# Patient Record
Sex: Female | Born: 1965 | Race: Black or African American | Hispanic: No | Marital: Married | Smoking: Former smoker
Health system: Southern US, Community
[De-identification: ages and names within clinical notes are randomized; demographics above are authoritative.]

## PROBLEM LIST (undated history)

## (undated) DIAGNOSIS — R011 Cardiac murmur, unspecified: Secondary | ICD-10-CM

## (undated) DIAGNOSIS — I10 Essential (primary) hypertension: Secondary | ICD-10-CM

## (undated) DIAGNOSIS — G43909 Migraine, unspecified, not intractable, without status migrainosus: Secondary | ICD-10-CM

## (undated) DIAGNOSIS — Z598 Other problems related to housing and economic circumstances: Secondary | ICD-10-CM

## (undated) DIAGNOSIS — I219 Acute myocardial infarction, unspecified: Secondary | ICD-10-CM

## (undated) DIAGNOSIS — Z599 Problem related to housing and economic circumstances, unspecified: Secondary | ICD-10-CM

## (undated) HISTORY — PX: OTHER SURGICAL HISTORY: SHX169

## (undated) HISTORY — PX: APPENDECTOMY: SHX54

## (undated) HISTORY — PX: CARDIAC CATHETERIZATION: SHX172

---
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
## 1997-12-20 HISTORY — PX: ESOPHAGOGASTRODUODENOSCOPY (EGD) WITH PROPOFOL: SHX5813

## 1999-12-21 HISTORY — PX: CARDIAC CATHETERIZATION: SHX172

## 2010-08-17 ENCOUNTER — Inpatient Hospital Stay (HOSPITAL_COMMUNITY): Admission: EM | Admit: 2010-08-17 | Discharge: 2010-08-18 | Payer: Self-pay | Admitting: Emergency Medicine

## 2010-08-17 ENCOUNTER — Ambulatory Visit: Payer: Self-pay | Admitting: Cardiology

## 2010-09-22 DIAGNOSIS — I10 Essential (primary) hypertension: Secondary | ICD-10-CM

## 2011-03-05 LAB — LIPID PANEL
Cholesterol: 155 mg/dL (ref 0–200)
HDL: 62 mg/dL (ref >39)
LDL Cholesterol: 80 mg/dL (ref 0–99)
Total CHOL/HDL Ratio: 2.5 RATIO
Triglycerides: 63 mg/dL (ref <150)

## 2011-03-05 LAB — COMPREHENSIVE METABOLIC PANEL
ALT: 11 U/L (ref 0–35)
Alkaline Phosphatase: 49 U/L (ref 39–117)
CO2: 25 mEq/L (ref 19–32)
Creatinine, Ser: 0.85 mg/dL (ref 0.4–1.2)
Potassium: 3.5 mEq/L (ref 3.5–5.1)
Sodium: 138 mEq/L (ref 135–145)

## 2011-03-05 LAB — CBC
Hemoglobin: 12.9 g/dL (ref 12.0–15.0)
MCH: 30.2 pg (ref 26.0–34.0)
MCH: 30.8 pg (ref 26.0–34.0)
MCHC: 33 g/dL (ref 30.0–36.0)
MCV: 91.7 fL (ref 78.0–100.0)
Platelets: 185 10*3/uL (ref 150–400)
Platelets: 196 10*3/uL (ref 150–400)
RBC: 4.19 MIL/uL (ref 3.87–5.11)
RBC: 4.2 MIL/uL (ref 3.87–5.11)
RDW: 13.9 % (ref 11.5–15.5)
WBC: 7.3 10*3/uL (ref 4.0–10.5)

## 2011-03-05 LAB — BASIC METABOLIC PANEL
BUN: 7 mg/dL (ref 6–23)
Calcium: 8.9 mg/dL (ref 8.4–10.5)
Creatinine, Ser: 0.87 mg/dL (ref 0.4–1.2)
GFR calc Af Amer: >60 mL/min (ref >60)
GFR calc non Af Amer: >60 mL/min (ref >60)
Glucose, Bld: 99 mg/dL (ref 70–99)
Sodium: 137 mEq/L (ref 135–145)

## 2011-03-05 LAB — PROTIME-INR: Prothrombin Time: 13.1 seconds (ref 11.6–15.2)

## 2011-03-05 LAB — CARDIAC PANEL(CRET KIN+CKTOT+MB+TROPI)
CK, MB: 0.9 ng/mL (ref 0.3–4.0)
Relative Index: 0.6 (ref 0.0–2.5)
Relative Index: 0.6 (ref 0.0–2.5)
Troponin I: 0.04 ng/mL (ref 0.00–0.06)

## 2011-03-05 LAB — APTT: aPTT: 31 seconds (ref 24–37)

## 2011-03-05 LAB — CK TOTAL AND CKMB (NOT AT ARMC): Relative Index: 0.5 (ref 0.0–2.5)

## 2011-03-05 LAB — TROPONIN I: Troponin I: 0.01 ng/mL (ref 0.00–0.06)

## 2011-03-05 LAB — DIFFERENTIAL
Basophils Absolute: 0.1 10*3/uL (ref 0.0–0.1)
Lymphs Abs: 2.2 10*3/uL (ref 0.7–4.0)
Monocytes Relative: 5 % (ref 3–12)
Neutro Abs: 4.5 10*3/uL (ref 1.7–7.7)
Neutrophils Relative %: 61 % (ref 43–77)

## 2011-03-05 LAB — HEPARIN LEVEL (UNFRACTIONATED): Heparin Unfractionated: 0.35 IU/mL (ref 0.30–0.70)

## 2011-03-05 LAB — POCT CARDIAC MARKERS
Myoglobin, poc: 45.2 ng/mL (ref 12–200)
Troponin i, poc: <0.05 ng/mL (ref 0.00–0.09)

## 2011-12-28 ENCOUNTER — Encounter: Payer: Self-pay | Admitting: Emergency Medicine

## 2011-12-28 ENCOUNTER — Emergency Department (HOSPITAL_COMMUNITY): Payer: Self-pay

## 2011-12-28 ENCOUNTER — Emergency Department (HOSPITAL_COMMUNITY)
Admission: EM | Admit: 2011-12-28 | Discharge: 2011-12-29 | Disposition: A | Discharge status: Home / Self Care | Payer: Self-pay | Attending: Emergency Medicine | Admitting: Emergency Medicine

## 2011-12-28 DIAGNOSIS — R05 Cough: Secondary | ICD-10-CM | POA: Insufficient documentation

## 2011-12-28 DIAGNOSIS — J45901 Unspecified asthma with (acute) exacerbation: Secondary | ICD-10-CM | POA: Insufficient documentation

## 2011-12-28 DIAGNOSIS — R0602 Shortness of breath: Secondary | ICD-10-CM | POA: Insufficient documentation

## 2011-12-28 DIAGNOSIS — R059 Cough, unspecified: Secondary | ICD-10-CM | POA: Insufficient documentation

## 2011-12-28 DIAGNOSIS — J45909 Unspecified asthma, uncomplicated: Secondary | ICD-10-CM

## 2011-12-28 MED ORDER — ALBUTEROL SULFATE (5 MG/ML) 0.5% IN NEBU
2.5000 mg | INHALATION_SOLUTION | Freq: Once | RESPIRATORY_TRACT | Status: AC
  Administered 2011-12-28: 5 mg via RESPIRATORY_TRACT
  Filled 2011-12-28: qty 1

## 2011-12-28 NOTE — ED Notes (Signed)
PT. REPORTS PROGRESSING SOB WITH COUGH AND WHEEZING ONSET TODAY .

## 2011-12-29 MED ORDER — IPRATROPIUM BROMIDE 0.02 % IN SOLN
RESPIRATORY_TRACT | Status: AC
  Administered 2011-12-29: 02:00:00
  Filled 2011-12-29: qty 2.5

## 2011-12-29 MED ORDER — PREDNISONE 20 MG PO TABS
ORAL_TABLET | ORAL | Status: AC
  Administered 2011-12-29: 60 mg via ORAL
  Filled 2011-12-29: qty 3

## 2011-12-29 MED ORDER — ALBUTEROL SULFATE HFA 108 (90 BASE) MCG/ACT IN AERS
2.0000 | INHALATION_SPRAY | RESPIRATORY_TRACT | Status: DC | PRN
  Administered 2011-12-29: 2 via RESPIRATORY_TRACT
  Filled 2011-12-29: qty 6.7

## 2011-12-29 MED ORDER — ALBUTEROL SULFATE (5 MG/ML) 0.5% IN NEBU
INHALATION_SOLUTION | RESPIRATORY_TRACT | Status: AC
  Administered 2011-12-29: 02:00:00
  Filled 2011-12-29: qty 1

## 2011-12-29 MED ORDER — PREDNISONE 10 MG PO TABS: 20.0000 mg | ORAL_TABLET | Freq: Every day | ORAL | Status: DC

## 2011-12-29 MED ORDER — AMLODIPINE BESYLATE 2.5 MG PO TABS: 10.0000 mg | ORAL_TABLET | Freq: Every day | ORAL | Status: DC

## 2011-12-29 NOTE — ED Provider Notes (Signed)
Medical screening examination/treatment/procedure(s) were performed by non-physician practitioner and as supervising physician I was immediately available for consultation/collaboration.  Pt requested refill of her norvasc  Lyanne Co, MD 12/29/11 [phone]

## 2011-12-29 NOTE — ED Provider Notes (Signed)
History     CSN: [id number]  Arrival date & time 12/28/11  2201   None     Chief Complaint  Patient presents with  . Shortness of Breath    (Consider location/radiation/quality/duration/timing/severity/associated sxs/prior treatment) HPI Comments: Patient with remote history of asthma as a child.  Now is wheezing, coughing, short of breath.  All day today  Patient is a 46 y.o. female presenting with shortness of breath.  Shortness of Breath  The current episode started today. The problem has been gradually worsening. The problem is moderate. The symptoms are relieved by nothing. The symptoms are aggravated by activity. Associated symptoms include cough, shortness of breath and wheezing. Pertinent negatives include no chest pain, no fever and no rhinorrhea.    Past Medical History  Diagnosis Date  . Asthma     History reviewed. No pertinent past surgical history.  No family history on file.  History  Substance Use Topics  . Smoking status: Never Smoker   . Smokeless tobacco: Not on file  . Alcohol Use: No    OB History    Grav Para Term Preterm Abortions TAB SAB Ect Mult Living                  Review of Systems  Constitutional: Negative for fever.  HENT: Negative for rhinorrhea.   Eyes: Negative.   Respiratory: Positive for cough, shortness of breath and wheezing.   Cardiovascular: Negative for chest pain.  Genitourinary: Negative for dysuria.  Musculoskeletal: Negative.   Neurological: Negative for dizziness.    Allergies  Benadryl and Penicillins  Home Medications   Current Outpatient Rx  Name Route Sig Dispense Refill  . NORVASC PO Oral Take 1 tablet by mouth daily. Dose unknown & pharmacy closed     . PREDNISONE 10 MG PO TABS Oral Take 2 tablets (20 mg total) by mouth daily. 15 tablet 0    BP 158/108  Pulse 97  Temp(Src) 98.9 F (37.2 C) (Oral)  Resp 22  SpO2 99%  Physical Exam  Constitutional: She is oriented to person, place, and time.  She appears well-developed and well-nourished.  Neck: Normal range of motion.  Cardiovascular: Normal rate.   Pulmonary/Chest: No respiratory distress. She has wheezes.       Coughing chest wall is tender on the right  Abdominal: Soft. Bowel sounds are normal.  Musculoskeletal: Normal range of motion.  Neurological: She is alert and oriented to person, place, and time.  Skin: Skin is warm and dry.  Psychiatric: She has a normal mood and affect.    ED Course  Procedures (including critical care time)  Labs Reviewed - No data to display Dg Chest 2 View  12/28/2011  *RADIOLOGY REPORT*  Clinical Data: Cough.  Fever.  CHEST - 2 VIEW  Comparison: 08/17/2010.  Findings: Mild basilar atelectasis.  No airspace disease.  No effusion.  Cardiopericardial silhouette and mediastinal contours appear within normal limits.  IMPRESSION: No acute cardiopulmonary disease.  Original Report Authenticated By: Andreas Newport, M.D.     1. Asthma, acute     After second albuterol, Atrovent, and by mouth prednisone.  Patient improved no longer wheezing, resting, comfortably we'll discharge with an inhaler, as well as a prescription for prednisone for the next 5 days  MDM  No pneumonia seen on x-ray.  This most likely asthma exacerbation.  Will treat with steroids and albuterol, Atrovent.  We'll monitor patient for short period of time  Arman Filter, NP 12/29/11 4540  Arman Filter, NP 12/29/11 9811

## 2011-12-29 NOTE — ED Notes (Signed)
2:52 vitals should have been entered as 01:20 from paper chart.

## 2011-12-30 NOTE — ED Notes (Signed)
Prescription for prednisone (deltasone) called in to [facility] as directed on discharge per gail schulz, np; pt states drug store did not receive via e-script.

## 2012-04-10 ENCOUNTER — Emergency Department (HOSPITAL_COMMUNITY)
Admission: EM | Admit: 2012-04-10 | Discharge: 2012-04-10 | Disposition: A | Discharge status: Home / Self Care | Payer: Self-pay | Attending: Emergency Medicine | Admitting: Emergency Medicine

## 2012-04-10 ENCOUNTER — Emergency Department (HOSPITAL_COMMUNITY): Payer: Self-pay

## 2012-04-10 ENCOUNTER — Encounter (HOSPITAL_COMMUNITY): Payer: Self-pay | Admitting: *Deleted

## 2012-04-10 DIAGNOSIS — J45901 Unspecified asthma with (acute) exacerbation: Secondary | ICD-10-CM | POA: Insufficient documentation

## 2012-04-10 MED ORDER — IBUPROFEN 200 MG PO TABS
400.0000 mg | ORAL_TABLET | Freq: Once | ORAL | Status: AC
  Administered 2012-04-10: 400 mg via ORAL
  Filled 2012-04-10: qty 2

## 2012-04-10 MED ORDER — ALBUTEROL SULFATE (5 MG/ML) 0.5% IN NEBU
5.0000 mg | INHALATION_SOLUTION | Freq: Once | RESPIRATORY_TRACT | Status: AC
  Administered 2012-04-10: 5 mg via RESPIRATORY_TRACT
  Filled 2012-04-10: qty 1

## 2012-04-10 MED ORDER — PREDNISONE 20 MG PO TABS
60.0000 mg | ORAL_TABLET | Freq: Once | ORAL | Status: AC
  Administered 2012-04-10: 60 mg via ORAL
  Filled 2012-04-10: qty 3

## 2012-04-10 MED ORDER — ALBUTEROL SULFATE HFA 108 (90 BASE) MCG/ACT IN AERS
2.0000 | INHALATION_SPRAY | Freq: Once | RESPIRATORY_TRACT | Status: AC
  Administered 2012-04-10: 2 via RESPIRATORY_TRACT
  Filled 2012-04-10: qty 6.7

## 2012-04-10 MED ORDER — PREDNISONE 20 MG PO TABS: 40.0000 mg | ORAL_TABLET | Freq: Every day | ORAL | Status: AC

## 2012-04-10 MED ORDER — IPRATROPIUM BROMIDE 0.02 % IN SOLN
0.5000 mg | Freq: Once | RESPIRATORY_TRACT | Status: DC
  Filled 2012-04-10: qty 2.5

## 2012-04-10 MED ORDER — ALBUTEROL SULFATE HFA 108 (90 BASE) MCG/ACT IN AERS: 2.0000 | INHALATION_SPRAY | RESPIRATORY_TRACT | Status: DC | PRN

## 2012-04-10 NOTE — ED Notes (Signed)
To ED with sob. States she had to use her customers inhaler. Pt unable to speak in full sentences. Coughing.

## 2012-04-10 NOTE — Discharge Instructions (Signed)
Asthma Attack Prevention HOW CAN ASTHMA BE PREVENTED? Currently, there is no way to prevent asthma from starting. However, you can take steps to control the disease and prevent its symptoms after you have been diagnosed. Learn about your asthma and how to control it. Take an active role to control your asthma by working with your caregiver to create and follow an asthma action plan. An asthma action plan guides you in taking your medicines properly, avoiding factors that make your asthma worse, tracking your level of asthma control, responding to worsening asthma, and seeking emergency care when needed. To track your asthma, keep records of your symptoms, check your peak flow number using a peak flow meter (handheld device that shows how well air moves out of your lungs), and get regular asthma checkups.  Other ways to prevent asthma attacks include:  Use medicines as your caregiver directs.   Identify and avoid things that make your asthma worse (as much as you can).   Keep track of your asthma symptoms and level of control.   Get regular checkups for your asthma.   With your caregiver, write a detailed plan for taking medicines and managing an asthma attack. Then be sure to follow your action plan. Asthma is an ongoing condition that needs regular monitoring and treatment.   Identify and avoid asthma triggers. A number of outdoor allergens and irritants (pollen, mold, cold air, air pollution) can trigger asthma attacks. Find out what causes or makes your asthma worse, and take steps to avoid those triggers (see below).   Monitor your breathing. Learn to recognize warning signs of an attack, such as slight coughing, wheezing or shortness of breath. However, your lung function may already decrease before you notice any signs or symptoms, so regularly measure and record your peak airflow with a home peak flow meter.   Identify and treat attacks early. If you act quickly, you're less likely to have  a severe attack. You will also need less medicine to control your symptoms. When your peak flow measurements decrease and alert you to an upcoming attack, take your medicine as instructed, and immediately stop any activity that may have triggered the attack. If your symptoms do not improve, get medical help.   Pay attention to increasing quick-relief inhaler use. If you find yourself relying on your quick-relief inhaler (such as albuterol), your asthma is not under control. See your caregiver about adjusting your treatment.  IDENTIFY AND CONTROL FACTORS THAT MAKE YOUR ASTHMA WORSE A number of common things can set off or make your asthma symptoms worse (asthma triggers). Keep track of your asthma symptoms for several weeks, detailing all the environmental and emotional factors that are linked with your asthma. When you have an asthma attack, go back to your asthma diary to see which factor, or combination of factors, might have contributed to it. Once you know what these factors are, you can take steps to control many of them.  Allergies: If you have allergies and asthma, it is important to take asthma prevention steps at home. Asthma attacks (worsening of asthma symptoms) can be triggered by allergies, which can cause temporary increased inflammation of your airways. Minimizing contact with the substance to which you are allergic will help prevent an asthma attack. Animal Dander:   Some people are allergic to the flakes of skin or dried saliva from animals with fur or feathers. Keep these pets out of your home.   If you can't keep a pet outdoors, keep the   pet out of your bedroom and other sleeping areas at all times, and keep the door closed.   Remove carpets and furniture covered with cloth from your home. If that is not possible, keep the pet away from fabric-covered furniture and carpets.  Dust Mites:  Many people with asthma are allergic to dust mites. Dust mites are tiny bugs that are found in  every home, in mattresses, pillows, carpets, fabric-covered furniture, bedcovers, clothes, stuffed toys, fabric, and other fabric-covered items.   Cover your mattress in a special dust-proof cover.   Cover your pillow in a special dust-proof cover, or wash the pillow each week in hot water. Water must be hotter than 130 F to kill dust mites. Cold or warm water used with detergent and bleach can also be effective.   Wash the sheets and blankets on your bed each week in hot water.   Try not to sleep or lie on cloth-covered cushions.   Call ahead when traveling and ask for a smoke-free hotel room. Bring your own bedding and pillows, in case the hotel only supplies feather pillows and down comforters, which may contain dust mites and cause asthma symptoms.   Remove carpets from your bedroom and those laid on concrete, if you can.   Keep stuffed toys out of the bed, or wash the toys weekly in hot water or cooler water with detergent and bleach.  Cockroaches:  Many people with asthma are allergic to the droppings and remains of cockroaches.   Keep food and garbage in closed containers. Never leave food out.   Use poison baits, traps, powders, gels, or paste (for example, boric acid).   If a spray is used to kill cockroaches, stay out of the room until the odor goes away.  Indoor Mold:  Fix leaky faucets, pipes, or other sources of water that have mold around them.   Clean moldy surfaces with a cleaner that has bleach in it.  Pollen and Outdoor Mold:  When pollen or mold spore counts are high, try to keep your windows closed.   Stay indoors with windows closed from late morning to afternoon, if you can. Pollen and some mold spore counts are highest at that time.   Ask your caregiver whether you need to take or increase anti-inflammatory medicine before your allergy season starts.  Irritants:   Tobacco smoke is an irritant. If you smoke, ask your caregiver how you can quit. Ask family  members to quit smoking, too. Do not allow smoking in your home or car.   If possible, do not use a wood-burning stove, kerosene heater, or fireplace. Minimize exposure to all sources of smoke, including incense, candles, fires, and fireworks.   Try to stay away from strong odors and sprays, such as perfume, talcum powder, hair spray, and paints.   Decrease humidity in your home and use an indoor air cleaning device. Reduce indoor humidity to below 60 percent. Dehumidifiers or central air conditioners can do this.   Try to have someone else vacuum for you once or twice a week, if you can. Stay out of rooms while they are being vacuumed and for a short while afterward.   If you vacuum, use a dust mask from a hardware store, a double-layered or microfilter vacuum cleaner bag, or a vacuum cleaner with a HEPA filter.   Sulfites in foods and beverages can be irritants. Do not drink beer or wine, or eat dried fruit, processed potatoes, or shrimp if they cause asthma   symptoms.   Cold air can trigger an asthma attack. Cover your nose and mouth with a scarf on cold or windy days.   Several health conditions can make asthma more difficult to manage, including runny nose, sinus infections, reflux disease, psychological stress, and sleep apnea. Your caregiver will treat these conditions, as well.   Avoid close contact with people who have a cold or the flu, since your asthma symptoms may get worse if you catch the infection from them. Wash your hands thoroughly after touching items that may have been handled by people with a respiratory infection.   Get a flu shot every year to protect against the flu virus, which often makes asthma worse for days or weeks. Also get a pneumonia shot once every five to 10 years.  Drugs:  Aspirin and other painkillers can cause asthma attacks. 10% to 20% of people with asthma have sensitivity to aspirin or a group of painkillers called non-steroidal anti-inflammatory drugs  (NSAIDS), such as ibuprofen and naproxen. These drugs are used to treat pain and reduce fevers. Asthma attacks caused by any of these medicines can be severe and even fatal. These drugs must be avoided in people who have known aspirin sensitive asthma. Products with acetaminophen are considered safe for people who have asthma. It is important that people with aspirin sensitivity read labels of all over-the-counter drugs used to treat pain, colds, coughs, and fever.   Beta blockers and ACE inhibitors are other drugs which you should discuss with your caregiver, in relation to your asthma.  ALLERGY SKIN TESTING  Ask your asthma caregiver about allergy skin testing or blood testing (RAST test) to identify the allergens to which you are sensitive. If you are found to have allergies, allergy shots (immunotherapy) for asthma may help prevent future allergies and asthma. With allergy shots, small doses of allergens (substances to which you are allergic) are injected under your skin on a regular schedule. Over a period of time, your body may become used to the allergen and less responsive with asthma symptoms. You can also take measures to minimize your exposure to those allergens. EXERCISE  If you have exercise-induced asthma, or are planning vigorous exercise, or exercise in cold, humid, or dry environments, prevent exercise-induced asthma by following your caregiver's advice regarding asthma treatment before exercising. Document Released: 11/24/2009 Document Revised: 11/25/2011 Document Reviewed: 11/24/2009 [facility] Patient Information 2012 [place], [place].Asthma Prevention Cigarette smoke, house dust, molds, pollens, animal dander, certain insects, exercise, and even cold air are all triggers that can cause an asthma attack. Often, no specific triggers are identified.  Take the following measures around your house to reduce attacks:  Avoid cigarette and other smoke. No smoking should be allowed in a home  where someone with asthma lives. If smoking is allowed indoors, it should be done in a room with a closed door, and a window should be opened to clear the air. If possible, do not use a wood-burning stove, kerosene heater, or fireplace. Minimize exposure to all sources of smoke, including incense, candles, fires, and fireworks.   Decrease pollen exposure. Keep your windows shut and use central air during the pollen allergy season. Stay indoors with windows closed from late morning to afternoon, if you can. Avoid mowing the lawn if you have grass pollen allergy. Change your clothes and shower after being outside during this time of year.   Remove molds from bathrooms and wet areas. Do this by cleaning the floors with a fungicide or diluted bleach.  Avoid using humidifiers, vaporizers, or swamp coolers. These can spread molds through the air. Fix leaky faucets, pipes, or other sources of water that have mold around them.   Decrease house dust exposure. Do this by using bare floors, vacuuming frequently, and changing furnace and air cooler filters frequently. Avoid using feather, wool, or foam bedding. Use polyester pillows and plastic covers over your mattress. Wash bedding weekly in hot water (hotter than 130 F).   Try to get someone else to vacuum for you once or twice a week, if you can. Stay out of rooms while they are being vacuumed and for a short while afterward. If you vacuum, use a dust mask (from a hardware store), a double-layered or microfilter vacuum cleaner bag, or a vacuum cleaner with a HEPA filter.   Avoid perfumes, talcum powder, hair spray, paints and other strong odors and fumes.   Keep warm-blooded pets (cats, dogs, rodents, birds) outside the home if they are triggers for asthma. If you can't keep the pet outdoors, keep the pet out of your bedroom and other sleeping areas at all times, and keep the door closed. Remove carpets and furniture covered with cloth from your home. If that is  not possible, keep the pet away from fabric-covered furniture and carpets.   Eliminate cockroaches. Keep food and garbage in closed containers. Never leave food out. Use poison baits, traps, powders, gels, or paste (for example, boric acid). If a spray is used to kill cockroaches, stay out of the room until the odor goes away.   Decrease indoor humidity to less than 60%. Use an indoor air cleaning device.   Avoid sulfites in foods and beverages. Do not drink beer or wine or eat dried fruit, processed potatoes, or shrimp if they cause asthma symptoms.   Avoid cold air. Cover your nose and mouth with a scarf on cold or windy days.   Avoid aspirin. This is the most common drug causing serious asthma attacks.   If exercise triggers your asthma, ask your caregiver how you should prepare before exercising. (For example, ask if you could use your inhaler 10 minutes before exercising.)   Avoid close contact with people who have a cold or the flu since your asthma symptoms may get worse if you catch the infection from them. Wash your hands thoroughly after touching items that may have been handled by others with a respiratory infection.   Get a flu shot every year to protect against the flu virus, which often makes asthma worse for days to weeks. Also get a pneumonia shot once every five to 10 years.  Call your caregiver if you want further information about measures you can take to help prevent asthma attacks. Document Released: 12/06/2005 Document Revised: 11/25/2011 Document Reviewed: 10/14/2009 [facility] Patient Information 2012 [place], [place].Asthma, Adult Asthma is caused by narrowing of the air passages in the lungs. It may be triggered by pollen, dust, animal dander, molds, some foods, respiratory infections, exposure to smoke, exercise, emotional stress or other allergens (things that cause allergic reactions or allergies). Repeat attacks are common. HOME CARE INSTRUCTIONS   Use prescription  medications as ordered by your caregiver.   Avoid pollen, dust, animal dander, molds, smoke and other things that cause attacks at home and at work.   You may have fewer attacks if you decrease dust in your home. Electrostatic air cleaners may help.   It may help to replace your pillows or mattress with materials less likely to cause allergies.  Talk to your caregiver about an action plan for managing asthma attacks at home, including, the use of a peak flow meter which measures the severity of your asthma attack. An action plan can help minimize or stop the attack without having to seek medical care.   If you are not on a fluid restriction, drink 8 to 10 glasses of water each day.   Always have a plan prepared for seeking medical attention, including, calling your physician, accessing local emergency care, and calling 911 (in the [place] for a severe attack.   Discuss possible exercise routines with your caregiver.   If animal dander is the cause of asthma, you may need to get rid of pets.  SEEK MEDICAL CARE IF:   You have wheezing and shortness of breath even if taking medicine to prevent attacks.   You have muscle aches, chest pain or thickening of sputum.   Your sputum changes from clear or white to yellow, green, gray, or bloody.   You have any problems that may be related to the medicine you are taking (such as a rash, itching, swelling or trouble breathing).  SEEK IMMEDIATE MEDICAL CARE IF:   Your usual medicines do not stop your wheezing or there is increased coughing and/or shortness of breath.   You have increased difficulty breathing.   You have a fever.  MAKE SURE YOU:   Understand these instructions.   Will watch your condition.   Will get help right away if you are not doing well or get worse.  Document Released: 12/06/2005 Document Revised: 11/25/2011 Document Reviewed: 07/24/2008 [facility] Patient Information 2012 [place], [place].  RESOURCE GUIDE  Dental  Problems  Patients with Medicaid: [facility] [phone]                                           [phone] Phone:  [phone]                                                  Phone:  [phone]  If unable to pay or uninsured, contact:  [facility] or [facility]. to become qualified for the adult dental clinic.  Chronic Pain Problems Contact [facility]  [phone] Patients need to be referred by their primary care doctor.  Insufficient Money for Medicine Contact [employer]:  call "211" or [facility] ([phone] [phone].  No Primary Care Doctor Call [facility]  503-361-8249 Other agencies that provide inexpensive medical care    [facility]  8165129013    [facility]  [phone]    [facility]  [phone]    [facility]  [phone]    [facility]  [phone]    [facility]  [phone]  Psychological Services [facility]  [phone] [facility]  [phone] [facility]   [phone] (emergency services ([phone])170-4434)  Substance Abuse Resources [facility]  [phone] [facility] [phone] [facility] [phone] Floydene Flock [phone] Residential & Outpatient Substance Abuse Program  ([phone]  Abuse/Neglect [facility] Child Abuse Hotline 765-646-6289 [facility] Child Abuse Hotline 3304992122 (After Hours)  Emergency Shelter [facility] [phone]  [facility] [place] [facility] 534-055-7975 [facility] [phone]  MRSA Hotline #:   (859) 020-6346    [facility] Resources  [facility] [place]     [employer]                          [facility] Dept. [address] [place]                       [address]      [address]    Blondell Reveal Phone:  [phone]                                   Phone:  [phone]                 Phone:  [phone]  [facility] Phone:  [phone]  [facility] Child Abuse Hotline [phone] (After Hours)

## 2012-04-15 NOTE — ED Provider Notes (Signed)
History    46 year old female with dyspnea. Patient has a history of asthma and says current symptoms feel similar to previous exacerbations. Patient did not have her inhaler on her and had to use a bystanders albuterol. No Fevers or chills. Onset of symptoms was a few hours prior to arrival and have been stable. No unusual leg pain or swelling. Denies history of blood clot. Denies any chest pain. CSN: [id number]  Arrival date & time 04/10/12  1743   First MD Initiated Contact with Patient 04/10/12 1804      Chief Complaint  Patient presents with  . Shortness of Breath    (Consider location/radiation/quality/duration/timing/severity/associated sxs/prior treatment) HPI  Past Medical History  Diagnosis Date  . Asthma     History reviewed. No pertinent past surgical history.  History reviewed. No pertinent family history.  History  Substance Use Topics  . Smoking status: Never Smoker   . Smokeless tobacco: Not on file  . Alcohol Use: No    OB History    Grav Para Term Preterm Abortions TAB SAB Ect Mult Living                  Review of Systems   Review of symptoms negative unless otherwise noted in HPI.   Allergies  Benadryl and Penicillins  Home Medications   Current Outpatient Rx  Name Route Sig Dispense Refill  . AMLODIPINE BESYLATE 10 MG PO TABS Oral Take 10 mg by mouth daily.    . ALBUTEROL SULFATE HFA 108 (90 BASE) MCG/ACT IN AERS Inhalation Inhale 2 puffs into the lungs every 4 (four) hours as needed for wheezing. 1 Inhaler 2  . PREDNISONE 20 MG PO TABS Oral Take 2 tablets (40 mg total) by mouth daily. 10 tablet 0    BP 143/86  Pulse 89  Temp(Src) 98.6 F (37 C) (Oral)  Resp 24  SpO2 96%  LMP 04/08/2012  Physical Exam  Nursing note and vitals reviewed. Constitutional: She appears well-developed and well-nourished.  HENT:  Head: Normocephalic and atraumatic.  Eyes: Conjunctivae are normal. Right eye exhibits no discharge. Left eye exhibits no  discharge.  Neck: Neck supple.  Cardiovascular: Normal rate, regular rhythm and normal heart sounds.  Exam reveals no gallop and no friction rub.   No murmur heard. Pulmonary/Chest: She has wheezes.       Mild tachypnea. 4-5 word sentences. Expiratory wheezing b/l  Abdominal: Soft. She exhibits no distension. There is no tenderness.  Musculoskeletal: She exhibits no edema and no tenderness.       Lower extremities symmetric as compared to each other. No calf tenderness. Negative Homan's. No palpable cords.   Neurological: She is alert.  Skin: Skin is warm and dry. She is not diaphoretic.  Psychiatric: She has a normal mood and affect. Her behavior is normal. Thought content normal.    ED Course  Procedures (including critical care time)  Labs Reviewed - No data to display No results found.  Dg Chest 2 View  04/10/2012  *RADIOLOGY REPORT*  Clinical Data: Shortness of breath.  Cough.  CHEST - 2 VIEW  Comparison: Chest x-ray 12/28/2011.  Findings: Lung volumes are normal.  No consolidative airspace disease.  No pleural effusions.  No pneumothorax.  No pulmonary nodule or mass noted.  Pulmonary vasculature and the cardiomediastinal silhouette are within normal limits.  IMPRESSION: 1. No radiographic evidence of acute cardiopulmonary disease.  Original Report Authenticated By: Florencia Reasons, M.D.   1. Asthma exacerbation  MDM  46 year old female with dyspnea. Likely secondary to asthma exacerbation. Patient subjectively feels much better after nebs and steroids in emergency department. Work of breathing has decreased. Oxygen saturations are good on room air. Plan discharge with continued steroids. Patient was also given a prescription for inhalers. Patient is afebrile chest x-ray is clear. Doubt pneumonia. Doubt pulmonary embolism. Return precautions were discussed. Outpatient followup otherwise        Raeford Razor, MD 04/15/12 1642

## 2012-05-11 ENCOUNTER — Emergency Department (HOSPITAL_COMMUNITY): Payer: Self-pay

## 2012-05-11 ENCOUNTER — Inpatient Hospital Stay (HOSPITAL_COMMUNITY)
Admission: EM | Admit: 2012-05-11 | Discharge: 2012-05-17 | DRG: 203 | Disposition: A | Discharge status: Home / Self Care | Payer: MEDICAID | Attending: Internal Medicine | Admitting: Internal Medicine

## 2012-05-11 ENCOUNTER — Encounter (HOSPITAL_COMMUNITY): Payer: Self-pay

## 2012-05-11 DIAGNOSIS — E875 Hyperkalemia: Secondary | ICD-10-CM | POA: Diagnosis present

## 2012-05-11 DIAGNOSIS — J45901 Unspecified asthma with (acute) exacerbation: Principal | ICD-10-CM | POA: Diagnosis present

## 2012-05-11 DIAGNOSIS — I10 Essential (primary) hypertension: Secondary | ICD-10-CM | POA: Diagnosis present

## 2012-05-11 DIAGNOSIS — J45902 Unspecified asthma with status asthmaticus: Secondary | ICD-10-CM

## 2012-05-11 DIAGNOSIS — J4540 Moderate persistent asthma, uncomplicated: Secondary | ICD-10-CM | POA: Diagnosis present

## 2012-05-11 HISTORY — DX: Problem related to housing and economic circumstances, unspecified: Z59.9

## 2012-05-11 HISTORY — DX: Essential (primary) hypertension: I10

## 2012-05-11 HISTORY — DX: Cardiac murmur, unspecified: R01.1

## 2012-05-11 HISTORY — DX: Other problems related to housing and economic circumstances: Z59.8

## 2012-05-11 LAB — DIFFERENTIAL
Basophils Absolute: 0 10*3/uL (ref 0.0–0.1)
Eosinophils Absolute: 0 10*3/uL (ref 0.0–0.7)
Lymphocytes Relative: 3 % — ABNORMAL LOW (ref 12–46)
Lymphs Abs: 0.5 10*3/uL — ABNORMAL LOW (ref 0.7–4.0)
Neutrophils Relative %: 97 % — ABNORMAL HIGH (ref 43–77)

## 2012-05-11 LAB — D-DIMER, QUANTITATIVE: D-Dimer, Quant: 0.47 ug/mL-FEU (ref 0.00–0.48)

## 2012-05-11 LAB — BASIC METABOLIC PANEL
CO2: 23 mEq/L (ref 19–32)
Calcium: 9.2 mg/dL (ref 8.4–10.5)
GFR calc non Af Amer: 88 mL/min — ABNORMAL LOW (ref >90)
Glucose, Bld: 161 mg/dL — ABNORMAL HIGH (ref 70–99)
Potassium: 3.3 mEq/L — ABNORMAL LOW (ref 3.5–5.1)
Sodium: 142 mEq/L (ref 135–145)

## 2012-05-11 LAB — CARDIAC PANEL(CRET KIN+CKTOT+MB+TROPI)
Relative Index: 1.4 (ref 0.0–2.5)
Total CK: 153 U/L (ref 7–177)

## 2012-05-11 LAB — CBC
Platelets: 303 10*3/uL (ref 150–400)
RBC: 4.2 MIL/uL (ref 3.87–5.11)
RDW: 13.9 % (ref 11.5–15.5)
WBC: 15.4 10*3/uL — ABNORMAL HIGH (ref 4.0–10.5)

## 2012-05-11 MED ORDER — IPRATROPIUM BROMIDE 0.02 % IN SOLN
RESPIRATORY_TRACT | Status: AC
  Administered 2012-05-11: 0.5 mg
  Filled 2012-05-11: qty 2.5

## 2012-05-11 MED ORDER — DOXYCYCLINE HYCLATE 100 MG PO TABS
100.0000 mg | ORAL_TABLET | Freq: Two times a day (BID) | ORAL | Status: DC
  Administered 2012-05-11 – 2012-05-12 (×2): 100 mg via ORAL
  Filled 2012-05-11 (×3): qty 1

## 2012-05-11 MED ORDER — ALBUTEROL SULFATE (5 MG/ML) 0.5% IN NEBU
2.5000 mg | INHALATION_SOLUTION | RESPIRATORY_TRACT | Status: DC
  Filled 2012-05-11: qty 1

## 2012-05-11 MED ORDER — METHYLPREDNISOLONE SODIUM SUCC 125 MG IJ SOLR
125.0000 mg | Freq: Once | INTRAMUSCULAR | Status: AC
  Administered 2012-05-11: 125 mg via INTRAVENOUS
  Filled 2012-05-11: qty 2

## 2012-05-11 MED ORDER — SODIUM CHLORIDE 0.9 % IJ SOLN
3.0000 mL | INTRAMUSCULAR | Status: DC | PRN
  Administered 2012-05-12: 3 mL via INTRAVENOUS

## 2012-05-11 MED ORDER — MAGNESIUM SULFATE 40 MG/ML IJ SOLN
2.0000 g | Freq: Once | INTRAMUSCULAR | Status: DC
  Filled 2012-05-11: qty 50

## 2012-05-11 MED ORDER — IPRATROPIUM BROMIDE 0.02 % IN SOLN
0.5000 mg | RESPIRATORY_TRACT | Status: DC
  Administered 2012-05-11 – 2012-05-12 (×5): 0.5 mg via RESPIRATORY_TRACT
  Filled 2012-05-11 (×5): qty 2.5

## 2012-05-11 MED ORDER — PREDNISONE 50 MG PO TABS
60.0000 mg | ORAL_TABLET | Freq: Every day | ORAL | Status: DC
  Filled 2012-05-11 (×2): qty 1

## 2012-05-11 MED ORDER — ACETAMINOPHEN 325 MG PO TABS
650.0000 mg | ORAL_TABLET | Freq: Four times a day (QID) | ORAL | Status: DC | PRN
  Administered 2012-05-11 – 2012-05-12 (×2): 650 mg via ORAL
  Filled 2012-05-11 (×3): qty 2

## 2012-05-11 MED ORDER — AMLODIPINE BESYLATE 10 MG PO TABS
10.0000 mg | ORAL_TABLET | Freq: Every day | ORAL | Status: DC
  Administered 2012-05-11 – 2012-05-17 (×7): 10 mg via ORAL
  Filled 2012-05-11 (×7): qty 1

## 2012-05-11 MED ORDER — SODIUM CHLORIDE 0.9 % IJ SOLN
3.0000 mL | Freq: Two times a day (BID) | INTRAMUSCULAR | Status: DC
  Administered 2012-05-11 – 2012-05-16 (×9): 3 mL via INTRAVENOUS

## 2012-05-11 MED ORDER — ALBUTEROL SULFATE (5 MG/ML) 0.5% IN NEBU
INHALATION_SOLUTION | RESPIRATORY_TRACT | Status: AC
  Filled 2012-05-11: qty 1

## 2012-05-11 MED ORDER — FLUTICASONE PROPIONATE HFA 44 MCG/ACT IN AERO
1.0000 | INHALATION_SPRAY | Freq: Two times a day (BID) | RESPIRATORY_TRACT | Status: DC
  Administered 2012-05-11 – 2012-05-17 (×12): 1 via RESPIRATORY_TRACT
  Filled 2012-05-11: qty 10.6

## 2012-05-11 MED ORDER — MAGNESIUM SULFATE 40 MG/ML IJ SOLN
2.0000 g | Freq: Once | INTRAMUSCULAR | Status: AC
  Administered 2012-05-11: 2 g via INTRAVENOUS
  Filled 2012-05-11: qty 50

## 2012-05-11 MED ORDER — IPRATROPIUM BROMIDE 0.02 % IN SOLN
RESPIRATORY_TRACT | Status: AC
  Administered 2012-05-11: 0.5 mg via RESPIRATORY_TRACT
  Filled 2012-05-11: qty 2.5

## 2012-05-11 MED ORDER — ALBUTEROL SULFATE (5 MG/ML) 0.5% IN NEBU
5.0000 mg | INHALATION_SOLUTION | Freq: Once | RESPIRATORY_TRACT | Status: AC
  Administered 2012-05-11: 5 mg via RESPIRATORY_TRACT
  Filled 2012-05-11: qty 1

## 2012-05-11 MED ORDER — ENOXAPARIN SODIUM 40 MG/0.4ML ~~LOC~~ SOLN
40.0000 mg | SUBCUTANEOUS | Status: DC
  Administered 2012-05-11 – 2012-05-16 (×6): 40 mg via SUBCUTANEOUS
  Filled 2012-05-11 (×8): qty 0.4

## 2012-05-11 MED ORDER — ALBUTEROL (5 MG/ML) CONTINUOUS INHALATION SOLN
10.0000 mg/h | INHALATION_SOLUTION | Freq: Once | RESPIRATORY_TRACT | Status: AC
  Administered 2012-05-11: 10 mg/h via RESPIRATORY_TRACT
  Filled 2012-05-11: qty 20

## 2012-05-11 MED ORDER — SODIUM CHLORIDE 0.9 % IV SOLN: 250.0000 mL | INTRAVENOUS | Status: DC | PRN

## 2012-05-11 MED ORDER — ALBUTEROL SULFATE (5 MG/ML) 0.5% IN NEBU
5.0000 mg | INHALATION_SOLUTION | Freq: Once | RESPIRATORY_TRACT | Status: AC
  Administered 2012-05-11: 5 mg via RESPIRATORY_TRACT

## 2012-05-11 MED ORDER — ALBUTEROL SULFATE (5 MG/ML) 0.5% IN NEBU: 2.5000 mg | INHALATION_SOLUTION | Freq: Once | RESPIRATORY_TRACT | Status: DC

## 2012-05-11 MED ORDER — ALBUTEROL SULFATE (5 MG/ML) 0.5% IN NEBU
2.5000 mg | INHALATION_SOLUTION | RESPIRATORY_TRACT | Status: DC
  Administered 2012-05-11 – 2012-05-12 (×6): 2.5 mg via RESPIRATORY_TRACT
  Filled 2012-05-11 (×6): qty 0.5

## 2012-05-11 MED ORDER — IPRATROPIUM BROMIDE 0.02 % IN SOLN
0.5000 mg | Freq: Once | RESPIRATORY_TRACT | Status: AC
  Administered 2012-05-11: 0.5 mg via RESPIRATORY_TRACT

## 2012-05-11 NOTE — ED Notes (Signed)
MD and PA at bedside.  

## 2012-05-11 NOTE — ED Notes (Addendum)
Pt c/o Chest tightness and pain. EKG done. Provider notified

## 2012-05-11 NOTE — ED Provider Notes (Signed)
5:33 PM Patient signed out to me by Michele Mcalpine PA-C. Patient was pending BNP. Plan is to admit to unassigned medicine for asthma exacerbation and failed improvement in the emergency department after 15 mg albuterol nebulizer. Spoke with outpatient clinics, Dr. Mikeal Hawthorne,  who accepts patient for admission MedSurg bed.  Thomasene Lot, PA-C 05/11/12 1734

## 2012-05-11 NOTE — ED Notes (Signed)
Respiratory notified of hour long neb.

## 2012-05-11 NOTE — ED Notes (Signed)
Pt presents to ed with sob, wheezing since yesterday. Pt states that she had 2 treatments at home and it was not helping her. Pt states she used her inhaler 5x. Pt was barely able to talk upon arrival to room. Pt started on breathing treatment per protocol.

## 2012-05-11 NOTE — ED Notes (Addendum)
Admitting MD at bedside.

## 2012-05-11 NOTE — ED Provider Notes (Signed)
History     CSN: [id number]  Arrival date & time 05/11/12  1156   First MD Initiated Contact with Patient 05/11/12 1201      Chief Complaint  Patient presents with  . Shortness of Breath    (Consider location/radiation/quality/duration/timing/severity/associated sxs/prior treatment) HPI Comments: Patient with history of asthma presents with worsening shortness of breath and wheezing starting yesterday but much worse this morning. She used a nebulizer from a friend as well as her home albuterol inhaler multiple times with very mild relief. She came to the emergency department for treatment and evaluation. She denies fever, upper respiratory tract infection symptoms, nausea or vomiting, abdominal pain. Patient has had some mild chest tightness associated with coughing and shortness of breath. Patient states she recently finished a course of prednisone approximately 5 days ago. Nothing makes symptoms worse. Course is constant.  Patient is a 46 y.o. female presenting with shortness of breath. The history is provided by the patient.  Shortness of Breath  The current episode started today. The onset was sudden. The problem has been unchanged. The symptoms are relieved by nothing. Associated symptoms include chest pressure, cough, shortness of breath and wheezing. Pertinent negatives include no orthopnea, no fever, no rhinorrhea and no sore throat. She has had prior hospitalizations. She has had prior intubations (as a child). Her past medical history is significant for asthma.    Past Medical History  Diagnosis Date  . Asthma   . Hypertension   . Murmur, cardiac   . Pneumonia     Past Surgical History  Procedure Date  . Appendectomy     History reviewed. No pertinent family history.  History  Substance Use Topics  . Smoking status: Never Smoker   . Smokeless tobacco: Not on file  . Alcohol Use: No    OB History    Grav Para Term Preterm Abortions TAB SAB Ect Mult Living             Review of Systems  Constitutional: Negative for fever.  HENT: Negative for sore throat and rhinorrhea.   Eyes: Negative for redness.  Respiratory: Positive for cough, chest tightness, shortness of breath and wheezing.   Cardiovascular: Negative for orthopnea and leg swelling.  Gastrointestinal: Negative for nausea, vomiting, abdominal pain and diarrhea.  Genitourinary: Negative for dysuria.  Musculoskeletal: Negative for myalgias.  Skin: Negative for rash.  Neurological: Negative for headaches.    Allergies  Benadryl and Penicillins  Home Medications   Current Outpatient Rx  Name Route Sig Dispense Refill  . ALBUTEROL SULFATE HFA 108 (90 BASE) MCG/ACT IN AERS Inhalation Inhale 2 puffs into the lungs every 4 (four) hours as needed for wheezing. 1 Inhaler 2  . AMLODIPINE BESYLATE 10 MG PO TABS Oral Take 10 mg by mouth daily.      BP 159/113  Pulse 126  Temp(Src) 98 F (36.7 C) (Oral)  Resp 25  SpO2 94%  LMP 05/04/2012  Physical Exam  Nursing note and vitals reviewed. Constitutional: She is oriented to person, place, and time. She appears well-developed and well-nourished.  HENT:  Head: Normocephalic and atraumatic.  Eyes: Conjunctivae are normal. Right eye exhibits no discharge. Left eye exhibits no discharge.  Neck: Normal range of motion. Neck supple.  Cardiovascular: Regular rhythm and normal heart sounds.  Tachycardia present.   Pulmonary/Chest: Accessory muscle usage present. Tachypnea noted. She is in respiratory distress. She has no decreased breath sounds. She has wheezes (inspiration and expiration) in the right upper field, the  right middle field, the right lower field, the left upper field, the left middle field and the left lower field.  Abdominal: Soft. There is no tenderness.  Musculoskeletal: She exhibits no edema and no tenderness.  Neurological: She is alert and oriented to person, place, and time.  Skin: Skin is warm and dry.  Psychiatric:  She has a normal mood and affect.    ED Course  Procedures (including critical care time)   Labs Reviewed  CBC  DIFFERENTIAL  BASIC METABOLIC PANEL  D-DIMER, QUANTITATIVE   Dg Chest Port 1 View  05/11/2012  *RADIOLOGY REPORT*  Clinical Data: Shortness of breath, asthma.  PORTABLE CHEST - 1 VIEW  Comparison: 04/10/2012.  Findings: Trachea is midline.  Heart size normal.  Lungs are clear. No pleural fluid.  IMPRESSION: No acute findings.  Original Report Authenticated By: Reyes Ivan, M.D.     1. Status asthmaticus     12:17 PM Patient seen and examined. Pt is having significant work of breathing with accessory muscle usage. Medications ordered.   Vital signs reviewed and are as follows: Filed Vitals:   05/11/12 1203  BP: 159/113  Pulse: 126  Temp: 98 F (36.7 C)  Resp: 25   2:06 PM Patient was having some chest pain related to coughing. EKG done per protocol.   Date: 05/11/2012  Rate: 86  Rhythm: normal sinus rhythm  QRS Axis: normal  Intervals: normal  ST/T Wave abnormalities: normal  Conduction Disutrbances:none  Narrative Interpretation:   Old EKG Reviewed: unchanged from 08/17/10  4:40 PM Very little progress made with breathing treatment followed by continuous neb. Patient seen with Dr. Judd Lien. Will admit to hospital for treatment. Exam: Continued significant wheezing with inspiration and expiration. Labs ordered.   5:05 PM Handoff to [employer] who will follow-up on BMP and admit to unassigned.    MDM  Admit for asthma exacerbation.        [name], [name] 05/11/12 [phone]

## 2012-05-11 NOTE — ED Notes (Signed)
Mag ordered from pharmacy 

## 2012-05-11 NOTE — H&P (Signed)
Hospital Admission Note Date: 05/11/2012  Patient name:  Marilyn Owen  Medical record number:  [id number] Date of birth:  1966-06-02  Age: 46 y.o. Gender:  female PCP:    DEFAULT,PROVIDER, MD, MD  Medical Service:   Internal Medicine Teaching Service   Medical Service: Maurice March  Attending physician: Dr. Josem Kaufmann    1st Contact:     Dr. Romeo Apple                  Pager: [phone] 2nd Contact:    Dr. Saralyn Pilar     Pager: [phone]   After 5 pm or weekends: 1st Contact:      Pager: [phone] 2nd Contact:      Pager: [phone]    Chief Complaint: Shortness of breath  History of Present Illness: Patient is a 46 y.o. female with a PMHx of asthma and hypertension presented to the ED with chief complaints of worsening shortness of breath. Patient was doing fairly well until yesterday when she started wheezing and had worsening of her shortness of breath. She used her home inhaler multiple times with only mild relief. Shortness of breath was much worse this morning and was associated with wheezing and she decided to come to the ED. She also complains of cough worse since 4 days. Cough is associated with yellow colored sputum every time. No blood noted. Denied any fever or chills. Patient denied any sick contacts, recent upper respiratory tract infections, contact with pets, change in environment, change in carpets or recent travel.   Patient's asthma was well controlled for 8 years until January when she had an episode of exacerbation but she was discharged from the ED with five-day course of prednisone and prescription for inhalers. Patient was also seen last month for similar symptoms but was discharged after she felt better with nebulization therapy in the ER. She has had shortness of breath over last month and used her inhaler at least twice a day. Patient does not have a primary care physician.   Current Outpatient Medications: Prior to Admission medications   Medication Sig Start Date End Date  Taking? Authorizing Provider  albuterol (PROVENTIL HFA;VENTOLIN HFA) 108 (90 BASE) MCG/ACT inhaler Inhale 2 puffs into the lungs every 4 (four) hours as needed. For wheezing. 04/10/12 04/10/13 Yes Raeford Razor, MD  amLODipine (NORVASC) 10 MG tablet Take 10 mg by mouth daily.   Yes Historical Provider, MD    Allergies: Benadryl; Penicillins; and Other  Past Medical History: Past Medical History  Diagnosis Date  . Asthma   . Hypertension   . Murmur, cardiac   . Pneumonia     Past Surgical History: Past Surgical History  Procedure Date  . Appendectomy     Family History: Mother living with a history of MI at 52.  Father deceased at age 50 from myocardial infarction and the patient has several siblings with one older brother with hypertension but no known coronary artery disease.  Social History: Patient has a college degree. She denies smoking. She used to use marijuana but has not done so in recent times. She has 4 kids. She works at [employer].  Review of Systems: Pertinent items are noted in HPI.  Vital Signs: BP 154/99  Pulse 117  Temp(Src) 98 F (36.7 C) (Oral)  Resp 24  SpO2 96%  LMP 05/04/2012  Physical Exam: General: Vital signs reviewed and noted. Well-developed, well-nourished, in some distress; alert, appropriate and cooperative throughout examination.  Head: Normocephalic, atraumatic.  Eyes: PERRL, EOMI, No signs  of anemia or jaundince.  Ears: TM nonerythematous, not bulging, good light reflex bilaterally.  Nose: Mucous membranes moist, not inflammed, nonerythematous, nasal flaring   Throat: Oropharynx nonerythematous, no exudate appreciated.   Neck: No deformities, masses, or tenderness noted.Supple, No carotid Bruits, no JVD.no accessory muscle use   Lungs:   normal respiratory effort, diffuse wheezing throughout lung fields with prolonged expiratory phase  Heart:  tachycardic but regular,  S1 and S2 normal, 3/6 ejection systolic murmur best heard at left  sternal border, without gallop or rubs.  Abdomen:  BS normoactive. Soft, Nondistended, non-tender.  No masses or organomegaly.  Extremities: No pretibial edema.  Neurologic: A&O X3, CN II - XII are grossly intact. Motor strength is 5/5 in the all 4 extremities, Sensations intact to light touch, Cerebellar signs negative.  Skin: No visible rashes, scars.   Lab results: CBC:    Component Value Date/Time   WBC 15.4* 05/11/2012 1607   HGB 13.0 05/11/2012 1607   HCT 39.5 05/11/2012 1607   PLT 303 05/11/2012 1607   MCV 94.0 05/11/2012 1607   NEUTROABS 14.9* 05/11/2012 1607   LYMPHSABS 0.5* 05/11/2012 1607   MONOABS 0.1 05/11/2012 1607   EOSABS 0.0 05/11/2012 1607   BASOSABS 0.0 05/11/2012 1607      Comprehensive Metabolic Panel:    Component Value Date/Time   NA 142 05/11/2012 1607   K 3.3* 05/11/2012 1607   CL 104 05/11/2012 1607   CO2 23 05/11/2012 1607   BUN 9 05/11/2012 1607   CREATININE 0.81 05/11/2012 1607   GLUCOSE 161* 05/11/2012 1607   CALCIUM 9.2 05/11/2012 1607   AST 16 08/18/2010 0513   ALT 11 08/18/2010 0513   ALKPHOS 49 08/18/2010 0513   BILITOT 0.7 08/18/2010 0513   PROT 6.6 08/18/2010 0513   ALBUMIN 3.3* 08/18/2010 0513     Lab Results  Component Value Date   CKTOTAL 131 08/18/2010   CKMB 0.8 08/18/2010   TROPONINI  Value: 0.04        NO INDICATION OF MYOCARDIAL INJURY. 08/18/2010    EKG: Nonspecific. Abnormalities in inferior and lateral leads. No previous EKG available.  Imaging results:  Chest x-ray: 1 view IMPRESSION:  No acute findings.   Assessment & Plan:  Patient is a 46 year old female with past medical history of asthma who comes in today with asthma exacerbation.  #1 Acute asthma exacerbation #2 hypertension #3 hypokalemia #4 abnormal EKG  We'll admit the patient to MedSurg as observation. Started on DuoNeb every 4 hours scheduled, doxycycline 100 mg, prednisone 60 mg by mouth, oxygen to titrate SpO2 over 90. Patient does have increased white count  although she does not have fevers. Based on the article in NEJM 2006 it is reasonable to start antibiotics and asthma exacerbation and was shown to be associated with improved symptoms/FEV1.  Restart home Norvasc.  Hypokalemia is probably related to albuterol use. No need to replete at this time and we will follow with BMET tomorrow morning.  Patient does not have chest pain and risk of having an acute coronary event is very low. One cardiac enzyme is negative. We will cycle cardiac enzymes given strong family history of premature coronary artery disease and abnormal EKG.  DVT PPX:  lovenox 40 SQ  Lars Mage MD R3 Internal Medicine Resident Pager [phone] 6:21 PM

## 2012-05-11 NOTE — ED Notes (Signed)
MD at bedside. 

## 2012-05-12 DIAGNOSIS — I10 Essential (primary) hypertension: Secondary | ICD-10-CM

## 2012-05-12 DIAGNOSIS — J45901 Unspecified asthma with (acute) exacerbation: Principal | ICD-10-CM

## 2012-05-12 DIAGNOSIS — E876 Hypokalemia: Secondary | ICD-10-CM

## 2012-05-12 LAB — BASIC METABOLIC PANEL
CO2: 24 mEq/L (ref 19–32)
Chloride: 105 mEq/L (ref 96–112)
Sodium: 140 mEq/L (ref 135–145)

## 2012-05-12 LAB — CARDIAC PANEL(CRET KIN+CKTOT+MB+TROPI): Relative Index: 1.7 (ref 0.0–2.5)

## 2012-05-12 MED ORDER — GUAIFENESIN-CODEINE 100-10 MG/5ML PO SOLN
10.0000 mL | Freq: Once | ORAL | Status: AC
  Administered 2012-05-12: 10 mL via ORAL
  Filled 2012-05-12: qty 10

## 2012-05-12 MED ORDER — PANTOPRAZOLE SODIUM 40 MG PO TBEC
40.0000 mg | DELAYED_RELEASE_TABLET | Freq: Every day | ORAL | Status: DC
  Administered 2012-05-12 – 2012-05-16 (×5): 40 mg via ORAL
  Filled 2012-05-12 (×2): qty 1

## 2012-05-12 MED ORDER — KETOROLAC TROMETHAMINE 30 MG/ML IJ SOLN
30.0000 mg | Freq: Four times a day (QID) | INTRAMUSCULAR | Status: DC | PRN
  Administered 2012-05-12 – 2012-05-13 (×5): 30 mg via INTRAVENOUS
  Filled 2012-05-12 (×5): qty 1

## 2012-05-12 MED ORDER — ALBUTEROL SULFATE (5 MG/ML) 0.5% IN NEBU
2.5000 mg | INHALATION_SOLUTION | RESPIRATORY_TRACT | Status: DC
  Administered 2012-05-12 – 2012-05-14 (×11): 2.5 mg via RESPIRATORY_TRACT
  Filled 2012-05-12 (×11): qty 0.5

## 2012-05-12 MED ORDER — ALBUTEROL SULFATE HFA 108 (90 BASE) MCG/ACT IN AERS
1.0000 | INHALATION_SPRAY | RESPIRATORY_TRACT | Status: DC | PRN
  Administered 2012-05-12: 2 via RESPIRATORY_TRACT
  Filled 2012-05-12: qty 6.7

## 2012-05-12 MED ORDER — POTASSIUM CHLORIDE CRYS ER 20 MEQ PO TBCR
40.0000 meq | EXTENDED_RELEASE_TABLET | Freq: Three times a day (TID) | ORAL | Status: AC
  Administered 2012-05-12 (×3): 40 meq via ORAL
  Filled 2012-05-12 (×3): qty 2

## 2012-05-12 MED ORDER — PREDNISONE 20 MG PO TABS
40.0000 mg | ORAL_TABLET | Freq: Every day | ORAL | Status: DC
  Administered 2012-05-12: 40 mg via ORAL
  Filled 2012-05-12 (×3): qty 2

## 2012-05-12 MED ORDER — IPRATROPIUM BROMIDE 0.02 % IN SOLN
0.5000 mg | RESPIRATORY_TRACT | Status: DC
  Administered 2012-05-12 – 2012-05-14 (×11): 0.5 mg via RESPIRATORY_TRACT
  Filled 2012-05-12 (×10): qty 2.5

## 2012-05-12 MED ORDER — TRAMADOL-ACETAMINOPHEN 37.5-325 MG PO TABS
1.0000 | ORAL_TABLET | Freq: Four times a day (QID) | ORAL | Status: DC | PRN
  Administered 2012-05-12 – 2012-05-14 (×5): 2 via ORAL
  Administered 2012-05-15: 1 via ORAL
  Administered 2012-05-15: 2 via ORAL
  Administered 2012-05-16: 1 via ORAL
  Filled 2012-05-12 (×9): qty 2

## 2012-05-12 MED ORDER — METHYLPREDNISOLONE SODIUM SUCC 125 MG IJ SOLR
60.0000 mg | Freq: Four times a day (QID) | INTRAMUSCULAR | Status: DC
  Administered 2012-05-12 – 2012-05-13 (×6): 60 mg via INTRAVENOUS
  Administered 2012-05-14: 62.5 mg via INTRAVENOUS
  Administered 2012-05-14: 60 mg via INTRAVENOUS
  Filled 2012-05-12 (×2): qty 0.96
  Filled 2012-05-12 (×2): qty 2
  Filled 2012-05-12 (×7): qty 0.96

## 2012-05-12 MED ORDER — HYDROCOD POLST-CHLORPHEN POLST 10-8 MG/5ML PO LQCR
5.0000 mL | Freq: Two times a day (BID) | ORAL | Status: DC | PRN
  Administered 2012-05-12 – 2012-05-13 (×3): 5 mL via ORAL
  Filled 2012-05-12 (×3): qty 5

## 2012-05-12 NOTE — Progress Notes (Signed)
Subjective: Feels only moderately improved, con't to have SOB and chest pain with inspiration.  Some mild HA as well.  Objective: Vital signs in last 24 hours: Filed Vitals:   05/11/12 2022 05/11/12 2200 05/12/12 0526 05/12/12 0600  BP:  144/98  139/99  Pulse:  106  80  Temp:  99.4 F (37.4 C)  98.6 F (37 C)  TempSrc:      Resp:  24  22  Height:      Weight:      SpO2: 99% 97% 95% 98%   Weight change:  No intake or output data in the 24 hours ending 05/12/12 0848  GEN: NAD.  Alert and oriented x 3.  Pleasant, conversant, and cooperative to exam. RESP:  Diffuse wheezing with prolonged expiratory phase. CARDIOVASCULAR: tachycardic, reg rate, S1, S2, no m/r/g ABDOMEN: soft, NT/ND, NABS EXT: warm and dry. No edema in b/l LE  Lab Results: Basic Metabolic Panel:  Lab 05/11/12 0981  NA 142  K 3.3*  CL 104  CO2 23  GLUCOSE 161*  BUN 9  CREATININE 0.81  CALCIUM 9.2  MG --  PHOS --   CBC:  Lab 05/11/12 1607  WBC 15.4*  NEUTROABS 14.9*  HGB 13.0  HCT 39.5  MCV 94.0  PLT 303   Cardiac Enzymes:  Lab 05/12/12 0321 05/11/12 1914  CKTOTAL 186* 153  CKMB 3.1 2.1  CKMBINDEX -- --  TROPONINI <0.30 <0.30   D-Dimer:  Lab 05/11/12 1607  DDIMER 0.47   Studies/Results: Dg Chest Port 1 View  05/11/2012  *RADIOLOGY REPORT*  Clinical Data: Shortness of breath, asthma.  PORTABLE CHEST - 1 VIEW  Comparison: 04/10/2012.  Findings: Trachea is midline.  Heart size normal.  Lungs are clear. No pleural fluid.  IMPRESSION: No acute findings.  Original Report Authenticated By: Reyes Ivan, M.D.   Medications: I have reviewed the patient's current medications. Scheduled Meds:   . ipratropium  0.5 mg Nebulization Q4H   And  . albuterol  2.5 mg Nebulization Q4H  . albuterol  5 mg Nebulization Once  . albuterol  5 mg Nebulization Once  . albuterol  10 mg/hr Nebulization Once  . amLODipine  10 mg Oral Daily  . doxycycline  100 mg Oral Q12H  . enoxaparin  40 mg  Subcutaneous Q24H  . fluticasone  1 puff Inhalation BID  . guaiFENesin-codeine  10 mL Oral Once  . ipratropium      . ipratropium  0.5 mg Nebulization Once  . magnesium sulfate 1 - 4 g bolus IVPB  2 g Intravenous Once  . methylPREDNISolone (SOLU-MEDROL) injection  125 mg Intravenous Once  . potassium chloride  40 mEq Oral TID  . predniSONE  40 mg Oral Q breakfast  . sodium chloride  3 mL Intravenous Q12H  . DISCONTD: albuterol  2.5 mg Nebulization Once  . DISCONTD: albuterol  2.5 mg Nebulization Q4H  . DISCONTD: magnesium sulfate 1 - 4 g bolus IVPB  2 g Intravenous Once  . DISCONTD: predniSONE  60 mg Oral Q breakfast   Continuous Infusions:  PRN Meds:.sodium chloride, acetaminophen, ketorolac, sodium chloride  Assessment/Plan: # Asthma Exacerbation Pt not significantly improved overnight, still tachypneic with moderate SOB.  Vitals stable, but definitely needs more therapy.  After discussion with primary team, will d/c abx and con't steroids, nebulizers, and pain control.  Starting on inhaled steroid to initiate chronic management. - con't prednisone 40mg  daily - con't duonebs - d/c abx - toradol and tramadol for  pain - tussionex for cough - bmet in a.m  # HTN Stable - con't norvasc  # Hypokalemia - repleted  # DVT - lovenox   LOS: 1 day   WILDMAN-TOBRINER, BEN 05/12/2012, 8:48 AM

## 2012-05-12 NOTE — ED Provider Notes (Signed)
Medical screening examination/treatment/procedure(s) were performed by non-physician practitioner and as supervising physician I was immediately available for consultation/collaboration.  Flint Melter, MD 05/12/12 [phone]

## 2012-05-12 NOTE — Progress Notes (Signed)
Pt states has headache, legs hurt, and her rib cage is sore with inspiration, and that Tylenol didn't really help.  Called placed to Dr. Joesphine Bare, orders received.

## 2012-05-12 NOTE — H&P (Signed)
Internal Medicine Attending Admission Note Date: 05/12/2012  Patient name: Marilyn Owen Medical record number: [id number] Date of birth: 1966-02-24 Age: 46 y.o. Gender: female  I saw and evaluated the patient. I reviewed the resident's note and I agree with the resident's findings and plan as documented in the resident's note.  Chief Complaint(s): Shortness of breath.  History - key components related to admission:  Marilyn Owen is a 46 year old woman with a history of asthma, hypertension, and premature birth who presents with a one-day history of worsening shortness of breath and wheezing. She needed to take her albuterol inhaler up to 24 times in the previous 24 hours with only minimal relief and therefore came to the emergency room realizing that her asthma was acting up and albuterol was not about to break the cycle. Associated with her shortness of breath is a cough that is productive of yellow sputum. She denies any fevers, shakes, chills, nausea, vomiting, or recent sick contacts. She has several allergies including to cats but has not had any recent exposures. She does admit to having heartburn and was previously treated with Nexium several years ago but has not been on any therapy lately. It should be noted that up until January her asthma was well controlled with just albuterol.  She has since had 2 exacerbations requiring evaluation in the emergency department or urgent care center. She is admitted at this time for further evaluation and care. She is without other complaints.  Physical Exam - key components related to admission:  Filed Vitals:   05/12/12 1015 05/12/12 1100 05/12/12 1432 05/12/12 1509  BP:   122/82   Pulse: 60  85   Temp: 98.5 F (36.9 C)  97.4 F (36.3 C)   TempSrc: Oral  Oral   Resp: 22  22   Height:      Weight:      SpO2: 94% 98% 97% 95%   General: Well-developed, well-nourished, woman lying in bed in moderate respiratory distress. Although she's not using  accessory muscles of respiration she does not use complete sentences to communicate because of the dyspnea. Lungs: Diffuse expiratory wheezing bilaterally with only moderately fair air movement. There were no crackles or rubs. Heart: Tachycardic, regular rate, without murmurs, rubs, or gallops. Abdomen: Soft, nontender, active bowel sounds. Extremities: Tender to palpation in all muscle groups, no edema.  Lab results:  Basic Metabolic Panel:  Basename 05/12/12 0907 05/11/12 1607  NA 140 142  K 3.6 3.3*  CL 105 104  CO2 24 23  GLUCOSE 102* 161*  BUN 6 9  CREATININE 0.70 0.81  CALCIUM 9.6 9.2  MG -- --  PHOS -- --   CBC:  Basename 05/11/12 1607  WBC 15.4*  NEUTROABS 14.9*  HGB 13.0  HCT 39.5  MCV 94.0  PLT 303   Cardiac Enzymes:  Basename 05/12/12 0321 05/11/12 1914  CKTOTAL 186* 153  CKMB 3.1 2.1  CKMBINDEX -- --  TROPONINI <0.30 <0.30   D-Dimer:  [facility] 05/11/12 1607  DDIMER 0.47   Imaging results:  Dg Chest Port 1 View  05/11/2012  *RADIOLOGY REPORT*  Clinical Data: Shortness of breath, asthma.  PORTABLE CHEST - 1 VIEW  Comparison: 04/10/2012.  Findings: Trachea is midline.  Heart size normal.  Lungs are clear. No pleural fluid.  IMPRESSION: No acute findings.  Original Report Authenticated By: Reyes Ivan, M.D.   Other results:  EKG: Normal sinus rhythm at 86 beats per minute, no LVH, good R wave progression, inferior T  wave inversions.  Assessment & Plan by Problem:  Marilyn Owen is a 45 year old woman with a history of asthma, hypertension, and premature birth who presents with increasing shortness of breath over a 24-hour period.  It has been unresponsive to bronchodilators. Her history and exam are consistent with an acute asthma exacerbation. Given the negative chest x-ray she likely has a viral bronchitis as her source. Allergies given the high recent pollen counts is also in the differential diagnosis but she does not complain of any sinus drainage  or watery eyes at this time. A definite exacerbator  of her asthma which may have also contributed to the previous recent worsening of her symptoms is her gastroesophageal reflux disease. She has symptomatic disease and has not received any treatment recently. With her worsening control of asthma over the last several months she is a candidate for a controller inhaler specifically an inhaled steroid. These can be costly and this may be a limiting factor but she has reapplied for Medicaid.  1) Asthma exacerbation: Will increase her anti-inflammatory steroid therapy to Solu-Medrol 60 mg IV every 6 hours and reassess in the morning. If she isn't improved we may taper it to a twice a day dosing tomorrow before going back to an oral prednisone regimen. There are no need for antibiotics at this time based on her exam and studies. We will aggressively treat her with bronchodilators that can include both albuterol and Atrovent in the acute setting. I demonstrated proper inhaler technique and stressed the use of a spacer with each dose. We will keep a close eye on her symptomatically. If she were to worsen we would have a low threshold to transfer her to either the step down unit or the intensive care unit. My suspicion is with aggressive anti-inflammatory and bronchodilator therapy we should be able to break the cycle of the asthma.  2) Gastroesophageal reflux disease: Will start PPI therapy as this may be exacerbating her asthma. She will need to be discharged on antireflux therapy.

## 2012-05-13 ENCOUNTER — Encounter (HOSPITAL_COMMUNITY): Payer: Self-pay | Admitting: Internal Medicine

## 2012-05-13 LAB — BASIC METABOLIC PANEL
CO2: 21 mEq/L (ref 19–32)
Chloride: 103 mEq/L (ref 96–112)
Glucose, Bld: 115 mg/dL — ABNORMAL HIGH (ref 70–99)
Potassium: 4.4 mEq/L (ref 3.5–5.1)
Sodium: 137 mEq/L (ref 135–145)

## 2012-05-13 MED ORDER — HYDROCOD POLST-CHLORPHEN POLST 10-8 MG/5ML PO LQCR: 10.0000 mL | Freq: Two times a day (BID) | ORAL | Status: DC | PRN

## 2012-05-13 MED ORDER — ALBUTEROL SULFATE HFA 108 (90 BASE) MCG/ACT IN AERS: 2.0000 | INHALATION_SPRAY | RESPIRATORY_TRACT | Status: DC | PRN

## 2012-05-13 MED ORDER — IPRATROPIUM BROMIDE 0.02 % IN SOLN
0.5000 mg | RESPIRATORY_TRACT | Status: DC | PRN
  Administered 2012-05-13 (×2): 0.5 mg via RESPIRATORY_TRACT
  Filled 2012-05-13 (×2): qty 2.5

## 2012-05-13 MED ORDER — HYDROCOD POLST-CHLORPHEN POLST 10-8 MG/5ML PO LQCR
5.0000 mL | Freq: Two times a day (BID) | ORAL | Status: DC | PRN
  Administered 2012-05-14 – 2012-05-17 (×5): 5 mL via ORAL
  Filled 2012-05-13 (×5): qty 5

## 2012-05-13 MED ORDER — BENZONATATE 100 MG PO CAPS
100.0000 mg | ORAL_CAPSULE | Freq: Three times a day (TID) | ORAL | Status: DC | PRN
  Administered 2012-05-13 – 2012-05-16 (×8): 100 mg via ORAL
  Filled 2012-05-13 (×8): qty 1

## 2012-05-13 MED ORDER — ALBUTEROL SULFATE (5 MG/ML) 0.5% IN NEBU
2.5000 mg | INHALATION_SOLUTION | RESPIRATORY_TRACT | Status: DC | PRN
  Administered 2012-05-13 (×2): 2.5 mg via RESPIRATORY_TRACT
  Filled 2012-05-13: qty 0.5

## 2012-05-13 NOTE — Progress Notes (Signed)
Subjective:    Currently, the patient states having improved but persistent shortness of breath. Nebs are helping to break up the chest congestion, cough mildly improved. Myalgias improved. No fevers, chills, nausea, vomiting, diarrhea.  Interval Events: None.   Objective:    Vital Signs:   Temp:  [97.4 F (36.3 C)-98.5 F (36.9 C)] 97.5 F (36.4 C) (05/25 0600) Pulse Rate:  [60-85] 73  (05/25 0600) Resp:  [22-24] 24  (05/25 0600) BP: (122-152)/(82-102) 152/93 mmHg (05/25 0600) SpO2:  [94 %-99 %] 96 % (05/25 0631) Last BM Date: 05/12/12  24-hour weight change: Weight change:   Intake/Output:   Intake/Output Summary (Last 24 hours) at 05/13/12 0744 Last data filed at 05/12/12 1700  Gross per 24 hour  Intake    840 ml  Output      0 ml  Net    840 ml      Physical Exam: General: Vital signs reviewed and noted. Well-developed, well-nourished, in no acute distress; alert, appropriate and cooperative throughout examination.  Lungs:  Normal respiratory effort. Diffuse inspiratory and expiratory wheezing.  Heart: RRR. S1 and S2 normal without gallop, murmur, or rubs.  Abdomen:  BS normoactive. Soft, Nondistended, nontender.  No masses or organomegaly.  Extremities: No pretibial edema.     Labs:  Basic Metabolic Panel:  Lab 05/13/12 1610 05/12/12 0907 05/11/12 1607  NA 137 140 142  K 4.4 3.6 3.3*  CL 103 105 104  CO2 21 24 23   GLUCOSE 115* 102* 161*  BUN 15 6 9   CREATININE 0.80 0.70 0.81  CALCIUM 10.0 9.6 9.2  MG 2.1 -- --  PHOS -- -- --    CBC:  Lab 05/11/12 1607  WBC 15.4*  NEUTROABS 14.9*  HGB 13.0  HCT 39.5  MCV 94.0  PLT 303    Cardiac Enzymes:  Lab 05/12/12 0321 05/11/12 1914  CKTOTAL 186* 153  CKMB 3.1 2.1  CKMBINDEX -- --  TROPONINI <0.30 <0.30    Imaging: Dg Chest Port 1 View  05/11/2012  *RADIOLOGY REPORT*  Clinical Data: Shortness of breath, asthma.  PORTABLE CHEST - 1 VIEW  Comparison: 04/10/2012.  Findings: Trachea is midline.   Heart size normal.  Lungs are clear. No pleural fluid.  IMPRESSION: No acute findings.  Original Report Authenticated By: Reyes Ivan, M.D.       Medications:    Infusions:    Scheduled Medications:    . ipratropium  0.5 mg Nebulization Q4H   And  . albuterol  2.5 mg Nebulization Q4H  . amLODipine  10 mg Oral Daily  . enoxaparin  40 mg Subcutaneous Q24H  . fluticasone  1 puff Inhalation BID  . methylPREDNISolone (SOLU-MEDROL) injection  60 mg Intravenous Q6H  . pantoprazole  40 mg Oral Q1200  . potassium chloride  40 mEq Oral TID  . sodium chloride  3 mL Intravenous Q12H  . DISCONTD: albuterol  2.5 mg Nebulization Q4H  . DISCONTD: doxycycline  100 mg Oral Q12H  . DISCONTD: ipratropium  0.5 mg Nebulization Q4H  . DISCONTD: predniSONE  40 mg Oral Q breakfast    PRN Medications: sodium chloride, acetaminophen, albuterol, albuterol, benzonatate, chlorpheniramine-HYDROcodone, ketorolac, sodium chloride, traMADol-acetaminophen, DISCONTD: albuterol   Assessment/ Plan:    Pt is a 46 y.o. yo female with a PMHx of uncontrolled baseline asthma and hypertension who was admitted on 05/11/2012 with symptoms of shortness of breath, which was determined to be secondary to asthma exacerbation, likely due to acute bronchitis. Interventions  at this time will be focused on optimizing home regimen while treating her acute exacerbation.   # Asthma Exacerbation - in setting of acute bronchitis. Pt somewhat improved overnight, still tachypneic with mild SOB.  Vitals stable. Starting on inhaled steroid to initiate chronic management. - Continue IV steriods at current dose. - con't duonebs - Will add breakthrough PRN albuterol - toradol and ultracet for pain - tussionex and will add tessalon for cough - bmet in a.m  # HTN Stable - con't norvasc  # Hypokalemia - due to albuterol usage. Resolved. - repleted  # DVT PPX -  - lovenox   Johnette Abraham, D.ODonnajean Lopes, Internal  Medicine Resident Pager: [phone] (7AM-5PM) 05/13/2012, 7:44 AM

## 2012-05-14 LAB — BASIC METABOLIC PANEL
CO2: 21 mEq/L (ref 19–32)
Glucose, Bld: 104 mg/dL — ABNORMAL HIGH (ref 70–99)
Potassium: 4 mEq/L (ref 3.5–5.1)
Sodium: 135 mEq/L (ref 135–145)

## 2012-05-14 MED ORDER — IPRATROPIUM BROMIDE 0.02 % IN SOLN
0.5000 mg | Freq: Four times a day (QID) | RESPIRATORY_TRACT | Status: DC
  Administered 2012-05-14 – 2012-05-15 (×3): 0.5 mg via RESPIRATORY_TRACT
  Filled 2012-05-14 (×3): qty 2.5

## 2012-05-14 MED ORDER — ALBUTEROL SULFATE (5 MG/ML) 0.5% IN NEBU
2.5000 mg | INHALATION_SOLUTION | Freq: Four times a day (QID) | RESPIRATORY_TRACT | Status: DC
  Administered 2012-05-14 – 2012-05-15 (×3): 2.5 mg via RESPIRATORY_TRACT
  Filled 2012-05-14 (×3): qty 0.5

## 2012-05-14 MED ORDER — METHYLPREDNISOLONE SODIUM SUCC 125 MG IJ SOLR
60.0000 mg | Freq: Two times a day (BID) | INTRAMUSCULAR | Status: DC
  Administered 2012-05-14: 60 mg via INTRAVENOUS
  Filled 2012-05-14 (×2): qty 0.96

## 2012-05-14 NOTE — Progress Notes (Signed)
5/26/ 1600 pt ambulated in hall for >218ft. O2 sat remained at 94% and better. Heart rate went as high as 134bpm.v Pt tolerated well. Marc Morgans, Rn

## 2012-05-14 NOTE — Progress Notes (Signed)
Subjective:    Currently, the patient is still experiencing shortness of breath, however, continues to have significant improvement from yesterday. Cough is also slowly improving. Symptoms seem to be exacerbated during her coughing fits. Still requiring q4h nebs.    Interval Events: None.   Objective:    Vital Signs:   Temp:  [97.1 F (36.2 C)-98.3 F (36.8 C)] 98.3 F (36.8 C) (05/26 0600) Pulse Rate:  [79-88] 79  (05/26 0600) Resp:  [20-24] 20  (05/26 0600) BP: (122-130)/(80-87) 130/87 mmHg (05/26 0600) SpO2:  [93 %-100 %] 97 % (05/26 0744) Last BM Date: 05/12/12  24-hour weight change: Weight change:   Intake/Output:   Intake/Output Summary (Last 24 hours) at 05/14/12 0931 Last data filed at 05/13/12 1238  Gross per 24 hour  Intake    360 ml  Output      0 ml  Net    360 ml      Physical Exam: General: Vital signs reviewed and noted. Well-developed, well-nourished, in no acute distress; alert, appropriate and cooperative throughout examination.  Lungs:  Normal respiratory effort. Improving end inspiratory and expiratory wheezing.  Heart: RRR. S1 and S2 normal without gallop, murmur, or rubs.  Abdomen:  BS normoactive. Soft, Nondistended, nontender.  No masses or organomegaly.  Extremities: No pretibial edema.     Labs:  Basic Metabolic Panel:  Lab 05/14/12 2841 05/13/12 0610 05/12/12 0907 05/11/12 1607  NA 135 137 140 142  K 4.0 4.4 3.6 3.3*  CL 100 103 105 104  CO2 21 21 24 23   GLUCOSE 104* 115* 102* 161*  BUN 22 15 6 9   CREATININE 0.95 0.80 0.70 0.81  CALCIUM 10.1 10.0 9.6 --  MG -- 2.1 -- --  PHOS -- -- -- --    CBC:  Lab 05/11/12 1607  WBC 15.4*  NEUTROABS 14.9*  HGB 13.0  HCT 39.5  MCV 94.0  PLT 303    Cardiac Enzymes:  Lab 05/12/12 0321 05/11/12 1914  CKTOTAL 186* 153  CKMB 3.1 2.1  CKMBINDEX -- --  TROPONINI <0.30 <0.30    Imaging: No results found.     Medications:    Infusions:    Scheduled Medications:    .  ipratropium  0.5 mg Nebulization Q4H   And  . albuterol  2.5 mg Nebulization Q4H  . amLODipine  10 mg Oral Daily  . enoxaparin  40 mg Subcutaneous Q24H  . fluticasone  1 puff Inhalation BID  . methylPREDNISolone (SOLU-MEDROL) injection  60 mg Intravenous Q6H  . pantoprazole  40 mg Oral Q1200  . sodium chloride  3 mL Intravenous Q12H    PRN Medications: sodium chloride, acetaminophen, albuterol, benzonatate, chlorpheniramine-HYDROcodone, ipratropium, ketorolac, sodium chloride, traMADol-acetaminophen, DISCONTD: albuterol, DISCONTD: chlorpheniramine-HYDROcodone, DISCONTD: chlorpheniramine-HYDROcodone   Assessment/ Plan:    Pt is a 46 y.o. yo female with a PMHx of uncontrolled baseline asthma and hypertension who was admitted on 05/11/2012 with symptoms of shortness of breath, which was determined to be secondary to asthma exacerbation, likely due to acute bronchitis. Interventions at this time will be focused on optimizing home regimen while treating her acute exacerbation.   # Asthma Exacerbation - in setting of acute bronchitis. Pt somewhat improved overnight, still tachypneic with mild SOB.  Vitals stable. Starting on inhaled steroid to initiate chronic management. - Decrease IV steroids to BID - Add IS. - con't duonebs PRN and scheduled for now, will plan to deescalate tomorrow. - toradol and ultracet for pain - tussionex and will  add tessalon for cough - bmet in a.m  # HTN - well controlled. - con't norvasc  # Hypokalemia - Resolved. On admission, was due to excessive albuterol usage. - repleted  # Financial constraints -  Pt has no insurance, unable to afford inhalers in the past. Will need chronic inhalers. - Will consult CM to see what medication assistance can be provided.  DVT PPX - lovenox   Johnette Abraham, D.ODonnajean Lopes, Internal Medicine Resident Pager: [phone] (7AM-5PM) 05/14/2012, 9:31 AM

## 2012-05-15 MED ORDER — ALBUTEROL SULFATE (5 MG/ML) 0.5% IN NEBU
2.5000 mg | INHALATION_SOLUTION | Freq: Two times a day (BID) | RESPIRATORY_TRACT | Status: DC
  Administered 2012-05-15 – 2012-05-17 (×5): 2.5 mg via RESPIRATORY_TRACT
  Filled 2012-05-15 (×5): qty 0.5

## 2012-05-15 MED ORDER — PREDNISONE 20 MG PO TABS
40.0000 mg | ORAL_TABLET | Freq: Every day | ORAL | Status: DC
  Administered 2012-05-15 – 2012-05-17 (×3): 40 mg via ORAL
  Filled 2012-05-15 (×4): qty 2

## 2012-05-15 MED ORDER — ALBUTEROL SULFATE (5 MG/ML) 0.5% IN NEBU: 2.5000 mg | INHALATION_SOLUTION | Freq: Once | RESPIRATORY_TRACT | Status: DC

## 2012-05-15 MED ORDER — PHENOL 1.4 % MT LIQD
1.0000 | OROMUCOSAL | Status: DC | PRN
  Administered 2012-05-15: 1 via OROMUCOSAL
  Filled 2012-05-15: qty 177

## 2012-05-15 MED ORDER — POLYETHYLENE GLYCOL 3350 17 G PO PACK
17.0000 g | PACK | Freq: Every day | ORAL | Status: DC
  Administered 2012-05-15 – 2012-05-16 (×2): 17 g via ORAL
  Filled 2012-05-15 (×4): qty 1

## 2012-05-15 MED ORDER — ALBUTEROL SULFATE HFA 108 (90 BASE) MCG/ACT IN AERS
2.0000 | INHALATION_SPRAY | RESPIRATORY_TRACT | Status: DC | PRN
  Filled 2012-05-15: qty 6.7

## 2012-05-15 MED ORDER — PNEUMOCOCCAL VAC POLYVALENT 25 MCG/0.5ML IJ INJ
0.5000 mL | INJECTION | INTRAMUSCULAR | Status: AC
  Administered 2012-05-16: 0.5 mL via INTRAMUSCULAR
  Filled 2012-05-15: qty 0.5

## 2012-05-15 MED ORDER — IPRATROPIUM BROMIDE 0.02 % IN SOLN
0.5000 mg | Freq: Two times a day (BID) | RESPIRATORY_TRACT | Status: DC
  Administered 2012-05-15 – 2012-05-16 (×2): 0.5 mg via RESPIRATORY_TRACT
  Filled 2012-05-15 (×2): qty 2.5

## 2012-05-15 MED ORDER — DOCUSATE SODIUM 100 MG PO CAPS
100.0000 mg | ORAL_CAPSULE | Freq: Two times a day (BID) | ORAL | Status: DC
  Administered 2012-05-15 – 2012-05-17 (×5): 100 mg via ORAL
  Filled 2012-05-15 (×3): qty 1

## 2012-05-15 NOTE — Progress Notes (Signed)
Subjective:    Pt con't to show improvement in cough, wheezing, and SOB.  Was able to ambulate yesterday.  Does c/o some constipation.  Interval Events: None.   Objective:    Vital Signs:   Temp:  [98.3 F (36.8 C)-98.5 F (36.9 C)] 98.3 F (36.8 C) (05/27 0653) Pulse Rate:  [80-95] 95  (05/27 0653) Resp:  [18-20] 18  (05/27 0653) BP: (125-139)/(82-92) 126/84 mmHg (05/27 0653) SpO2:  [94 %-96 %] 96 % (05/27 0700) Last BM Date: 05/12/12  24-hour weight change: Weight change:   Intake/Output:  No intake or output data in the 24 hours ending 05/15/12 0855    Physical Exam: General: Vital signs reviewed and noted. Well-developed, well-nourished, in no acute distress; alert, appropriate and cooperative throughout examination.  Lungs:  Normal respiratory effort. Improving end inspiratory and expiratory wheezing.  Heart: RRR. S1 and S2 normal without gallop, murmur, or rubs.  Abdomen:  BS normoactive. Soft, Nondistended, nontender.  No masses or organomegaly.  Extremities: No pretibial edema.     Labs:  Basic Metabolic Panel:  Lab 05/14/12 1610 05/13/12 0610 05/12/12 0907 05/11/12 1607  NA 135 137 140 142  K 4.0 4.4 3.6 3.3*  CL 100 103 105 104  CO2 21 21 24 23   GLUCOSE 104* 115* 102* 161*  BUN 22 15 6 9   CREATININE 0.95 0.80 0.70 0.81  CALCIUM 10.1 10.0 9.6 --  MG -- 2.1 -- --  PHOS -- -- -- --    CBC:  Lab 05/11/12 1607  WBC 15.4*  NEUTROABS 14.9*  HGB 13.0  HCT 39.5  MCV 94.0  PLT 303    Cardiac Enzymes:  Lab 05/12/12 0321 05/11/12 1914  CKTOTAL 186* 153  CKMB 3.1 2.1  CKMBINDEX -- --  TROPONINI <0.30 <0.30    Imaging: No results found.     Medications:    Infusions:    Scheduled Medications:    . ipratropium  0.5 mg Nebulization Q6H   And  . albuterol  2.5 mg Nebulization Q6H  . amLODipine  10 mg Oral Daily  . docusate sodium  100 mg Oral BID  . enoxaparin  40 mg Subcutaneous Q24H  . fluticasone  1 puff Inhalation BID  .  pantoprazole  40 mg Oral Q1200  . polyethylene glycol  17 g Oral Daily  . predniSONE  40 mg Oral Q breakfast  . sodium chloride  3 mL Intravenous Q12H  . DISCONTD: albuterol  2.5 mg Nebulization Q4H  . DISCONTD: ipratropium  0.5 mg Nebulization Q4H  . DISCONTD: methylPREDNISolone (SOLU-MEDROL) injection  60 mg Intravenous Q6H  . DISCONTD: methylPREDNISolone (SOLU-MEDROL) injection  60 mg Intravenous Q12H    PRN Medications: sodium chloride, acetaminophen, albuterol, benzonatate, chlorpheniramine-HYDROcodone, ipratropium, ketorolac, sodium chloride, traMADol-acetaminophen   Assessment/ Plan:    Pt is a 46 y.o. yo female with a PMHx of uncontrolled baseline asthma and hypertension who was admitted on 05/11/2012 with symptoms of shortness of breath, which was determined to be secondary to asthma exacerbation, likely due to acute bronchitis. Interventions at this time will be focused on optimizing home regimen while treating her acute exacerbation.   # Asthma Exacerbation - in setting of acute bronchitis. Pt con't to improve, now with far less wheezing but some con't coughing and intermittent SOB.  Vitals stable.  Did reduce frequency of steroids and nebs yesterday, and will con't to deescalate today. somewhat improved overnight, still tachypneic with mild SOB.  Vitals stable. Starting on inhaled steroid to  initiate chronic management. - steroids to prednisone 40mg  PO daily - duonebs q12h, start albuterol inhaler q4 PRN - toradol and ultracet for pain - tussionex and will add tessalon for cough - bmet in a.m - con't IS  # HTN - well controlled. - con't norvasc  # Hypokalemia - Resolved. On admission, was due to excessive albuterol usage. - repleted  # Financial constraints -  Pt has no insurance, unable to afford inhalers in the past. Will need chronic inhalers. - Will consult CM to see what medication assistance can be provided  DVT PPX - lovenox

## 2012-05-15 NOTE — ED Provider Notes (Signed)
Medical screening examination/treatment/procedure(s) were conducted as a shared visit with non-physician practitioner(s) and myself.  I personally evaluated the patient during the encounter.  I saw the patient along with Marilyn Owen and agree with his note, assessment, and plan.  The patient presents with shortness of breath and wheezing that started yesterday and is getting worse.  She has a history of asthma but has not had a flareup in a while.  She denies productive cough, fever, or chest pain.  She has tried an inhaler at home with little relief.  On exam, vitals are stable and she is afebrile.  The heart is without murmurs.  The lungs have bilateral wheezing present.  The abdomen is benign.  She was given repeat nebs, and hour long neb, but has not improved.  Medicine will be consulted for admission for treatment of status asthmaticus.    Geoffery Lyons, MD 05/15/12 [id number]

## 2012-05-15 NOTE — Progress Notes (Signed)
   CARE MANAGEMENT NOTE 05/15/2012  Patient:  Marilyn Owen,Marilyn Owen   Account Number:  [id number]  Date Initiated:  05/15/2012  Documentation initiated by:  Darlyne Russian  Subjective/Objective Assessment:   Patient admitted with asthma exacerbation     Action/Plan:   Progression of care and discharge planning   Anticipated DC Date:  05/17/2012   Anticipated DC Plan:  HOME/SELF CARE      DC Planning Services  CM consult      Choice offered to / List presented to:             Status of service:  In process, will continue to follow Medicare Important Message given?   (If response is "NO", the following Medicare IM given date fields will be blank) Date Medicare IM given:   Date Additional Medicare IM given:    Discharge Disposition:    Per UR Regulation:    If discussed at Long Length of Stay Meetings, dates discussed:    Comments:  05/15/2012  [address] RN, [place] [phone] Met with patient regarding medication assistance. She has submitted the  Medicaid application on 05/02/2012. The cost of Ventolin $50 at [facility] and she gets as many $4 generics as possible and not able to afford the inhalers. She is not able to afford the nebulizer.  [facility]/ Mary: asked if the patient is eligible for a nebulizer with her Medicaid application pending. The nebulizer private pay is $56.00. The Albuterol and Ipratropium nebulizer medications can give her a starter kit ? if a one month supply at  no cost.  Pharmacy: spoke with [facility], the patient is eligible to get her inhalers.

## 2012-05-15 NOTE — Discharge Instructions (Signed)
Thank you for allowing [place] to be involved in your healthcare while you were hospitalized at [facility].   Please note that there have been changes to your home medications. Please look at your discharge medication list for details.  Please call the [facility] Outpatient [facility] at [phone] if you have any questions or concerns, or any difficulty getting any of your medications.  Please return to the ER if you have worsening of your symptoms or new severe symptoms arise.  Please try to see Rudell Cobb (financal help at Internal Medicine Clinic) ASAP to get orange card application started. See below for paperwork that will be required.   Paperwork that needs to be turned in to [employer] FOR ORANGE CARD ELIGIBILITY APPLICATION   1. Picture ID (Can't be expired) 2. Current Bill to establish proof of residency 3. W-2 & Tax return (if self-employed include "Schedule C"), if not filing Form 4506 4. 4 current Pay stubs for this year 5. Printout of other income (Social security, unemployment, child support, workmen's comp) 6. Food stamp award letter, if receiving  7. Life Insurance (Need copy of the front page, showing name Ins Co. Name, and face amount). 8. Statement for pension, 401-K, IRS (needs to have current balance) 9. Tax Value for cars, houses, mobile homes, and land (Get from [facility] Tax Department) 10. Disability Paperwork (showing status of case) 11. College students: Print out of [place] received, tuition cost, books, etc. 12. If no Income: Engineer, maintenance of support for free shelter, money, food, Catering manager.  Bring all that you can to your follow up appointment to start the process.    Asthma Attack Prevention HOW CAN ASTHMA BE PREVENTED? Currently, there is no way to prevent asthma from starting. However, you can take steps to control the disease and prevent its symptoms after you have been diagnosed. Learn about your asthma and how to control  it. Take an active role to control your asthma by working with your caregiver to create and follow an asthma action plan. An asthma action plan guides you in taking your medicines properly, avoiding factors that make your asthma worse, tracking your level of asthma control, responding to worsening asthma, and seeking emergency care when needed. To track your asthma, keep records of your symptoms, check your peak flow number using a peak flow meter (handheld device that shows how well air moves out of your lungs), and get regular asthma checkups.  Other ways to prevent asthma attacks include:  Use medicines as your caregiver directs.   Identify and avoid things that make your asthma worse (as much as you can).   Keep track of your asthma symptoms and level of control.   Get regular checkups for your asthma.   With your caregiver, write a detailed plan for taking medicines and managing an asthma attack. Then be sure to follow your action plan. Asthma is an ongoing condition that needs regular monitoring and treatment.   Identify and avoid asthma triggers. A number of outdoor allergens and irritants (pollen, mold, cold air, air pollution) can trigger asthma attacks. Find out what causes or makes your asthma worse, and take steps to avoid those triggers (see below).   Monitor your breathing. Learn to recognize warning signs of an attack, such as slight coughing, wheezing or shortness of breath. However, your lung function may already decrease before you notice any signs or symptoms, so regularly measure and record your peak airflow with a home peak flow  meter.   Identify and treat attacks early. If you act quickly, you're less likely to have a severe attack. You will also need less medicine to control your symptoms. When your peak flow measurements decrease and alert you to an upcoming attack, take your medicine as instructed, and immediately stop any activity that may have triggered the attack. If your  symptoms do not improve, get medical help.   Pay attention to increasing quick-relief inhaler use. If you find yourself relying on your quick-relief inhaler (such as albuterol), your asthma is not under control. See your caregiver about adjusting your treatment.  IDENTIFY AND CONTROL FACTORS THAT MAKE YOUR ASTHMA WORSE A number of common things can set off or make your asthma symptoms worse (asthma triggers). Keep track of your asthma symptoms for several weeks, detailing all the environmental and emotional factors that are linked with your asthma. When you have an asthma attack, go back to your asthma diary to see which factor, or combination of factors, might have contributed to it. Once you know what these factors are, you can take steps to control many of them.  Allergies: If you have allergies and asthma, it is important to take asthma prevention steps at home. Asthma attacks (worsening of asthma symptoms) can be triggered by allergies, which can cause temporary increased inflammation of your airways. Minimizing contact with the substance to which you are allergic will help prevent an asthma attack. Animal Dander:   Some people are allergic to the flakes of skin or dried saliva from animals with fur or feathers. Keep these pets out of your home.   If you can't keep a pet outdoors, keep the pet out of your bedroom and other sleeping areas at all times, and keep the door closed.   Remove carpets and furniture covered with cloth from your home. If that is not possible, keep the pet away from fabric-covered furniture and carpets.  Dust Mites:  Many people with asthma are allergic to dust mites. Dust mites are tiny bugs that are found in every home, in mattresses, pillows, carpets, fabric-covered furniture, bedcovers, clothes, stuffed toys, fabric, and other fabric-covered items.   Cover your mattress in a special dust-proof cover.   Cover your pillow in a special dust-proof cover, or wash the  pillow each week in hot water. Water must be hotter than 130 F to kill dust mites. Cold or warm water used with detergent and bleach can also be effective.   Wash the sheets and blankets on your bed each week in hot water.   Try not to sleep or lie on cloth-covered cushions.   Call ahead when traveling and ask for a smoke-free hotel room. Bring your own bedding and pillows, in case the hotel only supplies feather pillows and down comforters, which may contain dust mites and cause asthma symptoms.   Remove carpets from your bedroom and those laid on concrete, if you can.   Keep stuffed toys out of the bed, or wash the toys weekly in hot water or cooler water with detergent and bleach.  Cockroaches:  Many people with asthma are allergic to the droppings and remains of cockroaches.   Keep food and garbage in closed containers. Never leave food out.   Use poison baits, traps, powders, gels, or paste (for example, boric acid).   If a spray is used to kill cockroaches, stay out of the room until the odor goes away.  Indoor Mold:  Fix leaky faucets, pipes, or other sources  of water that have mold around them.   Clean moldy surfaces with a cleaner that has bleach in it.  Pollen and Outdoor Mold:  When pollen or mold spore counts are high, try to keep your windows closed.   Stay indoors with windows closed from late morning to afternoon, if you can. Pollen and some mold spore counts are highest at that time.   Ask your caregiver whether you need to take or increase anti-inflammatory medicine before your allergy season starts.  Irritants:   Tobacco smoke is an irritant. If you smoke, ask your caregiver how you can quit. Ask family members to quit smoking, too. Do not allow smoking in your home or car.   If possible, do not use a wood-burning stove, kerosene heater, or fireplace. Minimize exposure to all sources of smoke, including incense, candles, fires, and fireworks.   Try to stay  away from strong odors and sprays, such as perfume, talcum powder, hair spray, and paints.   Decrease humidity in your home and use an indoor air cleaning device. Reduce indoor humidity to below 60 percent. Dehumidifiers or central air conditioners can do this.   Try to have someone else vacuum for you once or twice a week, if you can. Stay out of rooms while they are being vacuumed and for a short while afterward.   If you vacuum, use a dust mask from a hardware store, a double-layered or microfilter vacuum cleaner bag, or a vacuum cleaner with a HEPA filter.   Sulfites in foods and beverages can be irritants. Do not drink beer or wine, or eat dried fruit, processed potatoes, or shrimp if they cause asthma symptoms.   Cold air can trigger an asthma attack. Cover your nose and mouth with a scarf on cold or windy days.   Several health conditions can make asthma more difficult to manage, including runny nose, sinus infections, reflux disease, psychological stress, and sleep apnea. Your caregiver will treat these conditions, as well.   Avoid close contact with people who have a cold or the flu, since your asthma symptoms may get worse if you catch the infection from them. Wash your hands thoroughly after touching items that may have been handled by people with a respiratory infection.   Get a flu shot every year to protect against the flu virus, which often makes asthma worse for days or weeks. Also get a pneumonia shot once every five to 10 years.  Drugs:  Aspirin and other painkillers can cause asthma attacks. 10% to 20% of people with asthma have sensitivity to aspirin or a group of painkillers called non-steroidal anti-inflammatory drugs (NSAIDS), such as ibuprofen and naproxen. These drugs are used to treat pain and reduce fevers. Asthma attacks caused by any of these medicines can be severe and even fatal. These drugs must be avoided in people who have known aspirin sensitive asthma. Products  with acetaminophen are considered safe for people who have asthma. It is important that people with aspirin sensitivity read labels of all over-the-counter drugs used to treat pain, colds, coughs, and fever.   Beta blockers and ACE inhibitors are other drugs which you should discuss with your caregiver, in relation to your asthma.  ALLERGY SKIN TESTING  Ask your asthma caregiver about allergy skin testing or blood testing (RAST test) to identify the allergens to which you are sensitive. If you are found to have allergies, allergy shots (immunotherapy) for asthma may help prevent future allergies and asthma. With allergy shots,  small doses of allergens (substances to which you are allergic) are injected under your skin on a regular schedule. Over a period of time, your body may become used to the allergen and less responsive with asthma symptoms. You can also take measures to minimize your exposure to those allergens. EXERCISE  If you have exercise-induced asthma, or are planning vigorous exercise, or exercise in cold, humid, or dry environments, prevent exercise-induced asthma by following your caregiver's advice regarding asthma treatment before exercising. Document Released: 11/24/2009 Document Revised: 11/25/2011 Document Reviewed: 11/24/2009 [facility] Patient Information 2012 [place], [place].  Asthma, Adult Asthma is caused by narrowing of the air passages in the lungs. It may be triggered by pollen, dust, animal dander, molds, some foods, respiratory infections, exposure to smoke, exercise, emotional stress or other allergens (things that cause allergic reactions or allergies). Repeat attacks are common. HOME CARE INSTRUCTIONS   Use prescription medications as ordered by your caregiver.   Avoid pollen, dust, animal dander, molds, smoke and other things that cause attacks at home and at work.   You may have fewer attacks if you decrease dust in your home. Electrostatic air cleaners may help.    It may help to replace your pillows or mattress with materials less likely to cause allergies.   Talk to your caregiver about an action plan for managing asthma attacks at home, including, the use of a peak flow meter which measures the severity of your asthma attack. An action plan can help minimize or stop the attack without having to seek medical care.   If you are not on a fluid restriction, drink 8 to 10 glasses of water each day.   Always have a plan prepared for seeking medical attention, including, calling your physician, accessing local emergency care, and calling 911 (in the [place] for a severe attack.   Discuss possible exercise routines with your caregiver.   If animal dander is the cause of asthma, you may need to get rid of pets.  SEEK MEDICAL CARE IF:   You have wheezing and shortness of breath even if taking medicine to prevent attacks.   You have muscle aches, chest pain or thickening of sputum.   Your sputum changes from clear or white to yellow, green, gray, or bloody.   You have any problems that may be related to the medicine you are taking (such as a rash, itching, swelling or trouble breathing).  SEEK IMMEDIATE MEDICAL CARE IF:   Your usual medicines do not stop your wheezing or there is increased coughing and/or shortness of breath.   You have increased difficulty breathing.   You have a fever.  MAKE SURE YOU:   Understand these instructions.   Will watch your condition.   Will get help right away if you are not doing well or get worse.  Document Released: 12/06/2005 Document Revised: 11/25/2011 Document Reviewed: 07/24/2008 [facility] Patient Information 2012 [place], [place].

## 2012-05-16 ENCOUNTER — Encounter (HOSPITAL_COMMUNITY): Payer: Self-pay | Admitting: Internal Medicine

## 2012-05-16 ENCOUNTER — Encounter: Payer: Self-pay | Admitting: Licensed Clinical Social Worker

## 2012-05-16 LAB — BASIC METABOLIC PANEL
BUN: 16 mg/dL (ref 6–23)
CO2: 27 mEq/L (ref 19–32)
Chloride: 102 mEq/L (ref 96–112)
Glucose, Bld: 86 mg/dL (ref 70–99)
Potassium: 3.7 mEq/L (ref 3.5–5.1)

## 2012-05-16 NOTE — Progress Notes (Signed)
Subjective:    Pt con't to show improvement in cough, wheezing, and SOB.  Has been ambulating.    Interval Events: None.   Objective:    Vital Signs:   Temp:  [98.3 F (36.8 C)-98.6 F (37 C)] 98.6 F (37 C) (05/28 0600) Pulse Rate:  [66-92] 66  (05/28 0600) Resp:  [20] 20  (05/28 0600) BP: (129-148)/(77-89) 148/89 mmHg (05/28 0600) SpO2:  [92 %-97 %] 96 % (05/28 0800) Last BM Date: 05/12/12  24-hour weight change: Weight change:   Intake/Output:  No intake or output data in the 24 hours ending 05/16/12 0936    Physical Exam: General: Vital signs reviewed and noted. Well-developed, well-nourished, in no acute distress; alert, appropriate and cooperative throughout examination.  Lungs:  Normal respiratory effort. Trace expiratory wheezing.  Heart: RRR. S1 and S2 normal without gallop, murmur, or rubs.  Abdomen:  BS normoactive. Soft, Nondistended, nontender.  No masses or organomegaly.  Extremities: No pretibial edema.     Labs:  Basic Metabolic Panel:  Lab 05/16/12 2130 05/14/12 0555 05/13/12 0610 05/12/12 0907 05/11/12 1607  NA 138 135 137 140 142  K 3.7 4.0 4.4 3.6 3.3*  CL 102 100 103 105 104  CO2 27 21 21 24 23   GLUCOSE 86 104* 115* 102* 161*  BUN 16 22 15 6 9   CREATININE 0.88 0.95 0.80 0.70 0.81  CALCIUM 9.3 10.1 10.0 -- --  MG -- -- 2.1 -- --  PHOS -- -- -- -- --    CBC:  Lab 05/11/12 1607  WBC 15.4*  NEUTROABS 14.9*  HGB 13.0  HCT 39.5  MCV 94.0  PLT 303    Cardiac Enzymes:  Lab 05/12/12 0321 05/11/12 1914  CKTOTAL 186* 153  CKMB 3.1 2.1  CKMBINDEX -- --  TROPONINI <0.30 <0.30    Imaging: No results found.     Medications:    Infusions:    Scheduled Medications:    . albuterol  2.5 mg Nebulization BID   And  . ipratropium  0.5 mg Nebulization BID  . amLODipine  10 mg Oral Daily  . docusate sodium  100 mg Oral BID  . enoxaparin  40 mg Subcutaneous Q24H  . fluticasone  1 puff Inhalation BID  . pantoprazole  40 mg Oral  Q1200  . pneumococcal 23 valent vaccine  0.5 mL Intramuscular Tomorrow-1000  . polyethylene glycol  17 g Oral Daily  . predniSONE  40 mg Oral Q breakfast  . sodium chloride  3 mL Intravenous Q12H  . DISCONTD: albuterol  2.5 mg Nebulization Once    PRN Medications: sodium chloride, acetaminophen, albuterol, benzonatate, chlorpheniramine-HYDROcodone, ketorolac, phenol, sodium chloride, traMADol-acetaminophen   Assessment/ Plan:    Pt is a 46 y.o. yo female with a PMHx of uncontrolled baseline asthma and hypertension who was admitted on 05/11/2012 with symptoms of shortness of breath, which was determined to be secondary to asthma exacerbation, likely due to acute bronchitis. Interventions at this time will be focused on optimizing home regimen while treating her acute exacerbation.   # Asthma Exacerbation - in setting of acute bronchitis. Pt con't to improve, now only trace wheezing and intermittent cough.  HR finally down.  On PO steroids on bid nebs and PRN inhaler. - prednisone 40mg  PO daily, will taper at d/c - albuterol neb bid, albuterol inhaler q4 PRN - flovent bid for chronic control - toradol and ultracet for pain - tussionex and will add tessalon for cough - con't IS  #  HTN - well controlled. - con't norvasc  # Hypokalemia - Resolved. On admission, was due to excessive albuterol usage. - repleted  # Financial constraints -  Pt has no insurance, unable to afford inhalers in the past. Will need chronic inhalers. - CM will a/w obtaining flovent and albuterol - provided pt with info for orange card - has f/u appt in clinic on Thursday - will get to keep inhalers from this inpt stay as well  # DVT PPX - lovenox  # Dispo Likely this afternoon or tomorrow pending sx and financial solutions

## 2012-05-16 NOTE — Progress Notes (Signed)
Pt was referred to this CSW as pt has a follow up appt at the [facility] after her d/c.  Pt has already spoke with Artist and will be bringing in documentation to apply for the [facility] card.  Pt states she has been ineligible for Medicaid over a year due to their computer error and her child was erroneous taken off Lunch program due to another error.  Children are current with medicaid. Her food stamps have been reduced due to Medicaid office having her making more than she actually does make. [name] is fairly new to the [place] area.  I met with [name] and her mother at bedside during hospitalization.  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  Pt has 4 children, one adult child outside of the home and 3 ages 11, 77 and 29.  [name] currently renting and behind on her rent, states she is close to being evicted.   Pt states she needs assistance printing out pay stubs online, advocacy with housing and Dept of Social [employer] Medicaid office.  Pt has appt in [facility] on Thursday, 05/18/12.  CSW to meet with pt after her scheduled MD appt.

## 2012-05-16 NOTE — Progress Notes (Signed)
Internal Medicine Attending  Date: 05/16/2012  Patient name: Marilyn Owen Medical record number: [id number] Date of birth: 01/18/1966 Age: 46 y.o. Gender: female  I saw and evaluated the patient. I reviewed the resident's note by Dr. Abner Greenspan and I agree with the resident's findings and plans as documented in his progress note.  Marilyn Owen was seen on rounds this morning. She notes marked improvement in her dyspnea over the weekend. She's been ambulating to the nurses station from her room. This has led to some dyspnea on exertion. She is able to speak in full senses without any distress. Lung exam reveals good air movement with very rare expiratory wheezes. I agree with the housestaff's plan to continue the oral prednisone, bronchodilators, and work on obtaining an inhaled steroid, if possible, at discharge. I suspect she may be ready for discharge tomorrow morning.

## 2012-05-17 MED ORDER — AMLODIPINE BESYLATE 10 MG PO TABS: 10.0000 mg | ORAL_TABLET | Freq: Every day | ORAL | Status: DC

## 2012-05-17 MED ORDER — FLUTICASONE PROPIONATE HFA 44 MCG/ACT IN AERO: 2.0000 | INHALATION_SPRAY | Freq: Two times a day (BID) | RESPIRATORY_TRACT | Status: DC

## 2012-05-17 MED ORDER — PREDNISONE 10 MG PO TABS: 10.0000 mg | ORAL_TABLET | Freq: Every day | ORAL | Status: DC

## 2012-05-17 NOTE — Progress Notes (Signed)
Pt d/c to home by car with family. Assessment stable. flovent and albuterol given to pt.

## 2012-05-17 NOTE — Discharge Summary (Signed)
Internal Medicine Teaching [facility] Discharge Note  Name: Marilyn Owen MRN: [id number] DOB: 12/16/1966 46 y.o.  Date of Admission: 05/11/2012 11:58 AM Date of Discharge: 05/17/2012 Attending Physician: Rocco Serene, MD  Discharge Diagnosis: Principal Problem:  *Asthma exacerbation Active Problems:  HYPERTENSION   Discharge Medications: Medication List  As of 05/17/2012 11:19 AM   TAKE these medications         albuterol 108 (90 BASE) MCG/ACT inhaler   Commonly known as: PROVENTIL HFA;VENTOLIN HFA   Inhale 2 puffs into the lungs every 4 (four) hours as needed. For wheezing.      amLODipine 10 MG tablet   Commonly known as: NORVASC   Take 1 tablet (10 mg total) by mouth daily.      fluticasone 44 MCG/ACT inhaler   Commonly known as: FLOVENT HFA   Inhale 2 puffs into the lungs 2 (two) times daily.      predniSONE 10 MG tablet   Commonly known as: DELTASONE   Take 1 tablet (10 mg total) by mouth daily. Take 4 pills for 1 day, 3 pills for 2 days, 2 pills for 2 days, 1 pill for 2 days, and stop.            Disposition and follow-up:   Marilyn Owen was discharged from [facility] in Stable condition.  At the hospital follow up visit please address  1. Asthma: pt was given flovent and albuterol as well as 7 day prednisone taper.  I assume she will be compliant as she is an excellent pt.  One thing is that we did not rx her a PPI b/c of cost.  She should consider a PPI/H2 blocker b/c this may be exacerbating her asthma.  Please discuss with patient.  2. Financial: pt needs orange card, already has list of requisites and has spoken to [employer].  Will likely see Deb after your visit.  3. BP: was slightly elevated in hospital, can consider additional agent.  4. Primary care: will have Shelly as PMD, so you can defer pap, other health maint stuff if you want, but if you have time you can discuss what she'll need as a 46 yo otherwise healthy woman.  Consider  mammo (? Family hx), pap, lipid panel already drawn and was good.  Follow-up Appointments: Follow-up Information    Follow up with Margorie John, MD on 05/18/2012. (INTERNAL MEDICINE OUTPATIENT CLINIC - Your appointment is on 05/18/2012 at 9:00AM. Please call the clinic if you need to cancel or reschedule the appointment.)    Contact information:   [address] [place] [address] [phone]         Discharge Orders    Future Appointments: Provider: Department: Dept Phone: Center:   05/18/2012 9:00 AM Margorie John, MD [facility] [phone] [facility]     Future Orders Please Complete By Expires   Diet - low sodium heart healthy      Increase activity slowly      Discharge instructions      Comments:   1. Please take your prednisone taper over 7 days 2. Please use flovent twice daily, albuterol only as needed 3. See you in clinic!      Consultations:  none  Procedures Performed:  Dg Chest Port 1 View  05/11/2012  *RADIOLOGY REPORT*  Clinical Data: Shortness of breath, asthma.  PORTABLE CHEST - 1 VIEW  Comparison: 04/10/2012.  Findings: Trachea is midline.  Heart size normal.  Lungs are clear. No pleural fluid.  IMPRESSION: No acute findings.  Original Report Authenticated By: Reyes Ivan, M.D.   Admission HPI: Marilyn Owen is a 46 year old woman with a history of asthma, hypertension, and premature birth who presents with a one-day history of worsening shortness of breath and wheezing. She needed to take her albuterol inhaler up to 24 times in the previous 24 hours with only minimal relief and therefore came to the emergency room realizing that her asthma was acting up and albuterol was not about to break the cycle. Associated with her shortness of breath is a cough that is productive of yellow sputum. She denies any fevers, shakes, chills, nausea, vomiting, or recent sick contacts. She has several allergies including to cats but has not had any recent  exposures. She does admit to having heartburn and was previously treated with Nexium several years ago but has not been on any therapy lately. It should be noted that up until January her asthma was well controlled with just albuterol. She has since had 2 exacerbations requiring evaluation in the emergency department or urgent care center. She is admitted at this time for further evaluation and care. She is without other complaints.   Hospital Course by problem list: # Asthma Exacerbation Patient initially presented with diffuse wheezing, shortness of breath, and cough in setting of acute bronchitis that likely caused her asthma exacerbation. The patient was on no controller medicine at home. The patient was initially started on nebulizer treatments, IV steroids (needed q6 for 36 hours). She was initially quite tachypneic and short of breath, but did not have desaturations. She slowly improved and was eventually space to PO steroids, inhalers instead of nebs, and was discharged on a steroid taper. She was given a Flovent inhaler as well as an albuterol inhaler, and she will followup in our clinic. Depending on cost, she'll consider being on a PPI, as acid reflux may have also served as a trigger for her asthma.  # HTN Patient was continued on amlodipine during hospitalization. Blood pressure was stable.  # Hypokalemia On admission, was due to excessive albuterol usage. Was repleted.  # Financial constraints Pt has no insurance, unable to afford inhalers in the past. Was given inhalers from the pharmacy to bridge her until she can get the orange card from our clinic. Will followup in clinic to get the orange card which will help with financial constraints. Our clinic social worker has or he may contact with the patient.  Discharge Vitals:  BP 159/93  Pulse 85  Temp(Src) 98.5 F (36.9 C) (Oral)  Resp 19  Ht 5\' 2"  (1.575 m)  Wt 173 lb 1 oz (78.5 kg)  BMI 31.65 kg/m2  SpO2 96%  LMP  05/04/2012  Discharge Labs: No results found for this or any previous visit (from the past 24 hour(s)).  SignedAbner Greenspan, BEN 05/17/2012, 11:19 AM   Time Spent on Discharge: 40 min

## 2012-05-17 NOTE — Care Management Note (Signed)
    Page 1 of 2   05/17/2012     3:03:55 PM   CARE MANAGEMENT NOTE 05/17/2012  Patient:  Marilyn Owen, Marilyn Owen   Account Number:  [id number]  Date Initiated:  05/15/2012  Documentation initiated by:  Darlyne Russian  Subjective/Objective Assessment:   Patient admitted with asthma exacerbation     Action/Plan:   Progression of care and discharge planning   Anticipated DC Date:  05/17/2012   Anticipated DC Plan:  HOME/SELF CARE      DC Planning Services  CM consult  Medication Assistance      Choice offered to / List presented to:             Status of service:  Completed, signed off Medicare Important Message given?   (If response is "NO", the following Medicare IM given date fields will be blank) Date Medicare IM given:   Date Additional Medicare IM given:    Discharge Disposition:  HOME/SELF CARE  Per UR Regulation:  Reviewed for med. necessity/level of care/duration of stay  If discussed at Long Length of Stay Meetings, dates discussed:   05/17/2012    Comments:  05/17/12 Obtained Flovent and Albuterol inhalers for patient through [employer]. Patient will f/u with Family Practice  post d/c. Jacquelynn Cree RN, BSN, CCM   05/16/12 Plan to obtain Flovent and albuterol inhalers through indigent fund and patient has follow up appt with Family Practice to apply for their orange card for medication assist Gave patient discount pharmacy card. Will continue to follow. Jacquelynn Cree RN, BSN, CCM  05/15/2012  [address] RN, [place] [phone] Met with patient regarding medication assistance. She has submitted the  Medicaid application on 05/02/2012. The cost of Ventolin $50 at [facility] and she gets as many $4 generics as possible and not able to afford the inhalers. She is not able to afford the nebulizer.  [facility]/ Marilyn Owen: asked if the patient is eligible for a nebulizer with her Medicaid application pending. The nebulizer private pay is $56.00. The Albuterol and Ipratropium nebulizer medications  can give her a starter kit ? if a one month supply at  no cost.  Pharmacy: spoke with Marilyn Owen, the patient is eligible to get her inhalers.

## 2012-05-17 NOTE — Progress Notes (Signed)
Internal Medicine Attending  Date: 05/17/2012  Patient name: Marilyn Owen Medical record number: [id number] Date of birth: 1966/08/23 Age: 46 y.o. Gender: female  I saw and evaluated the patient. I reviewed the resident's note by Dr. Abner Greenspan and I agree with the resident's findings and plans as documented in his progress note.  Marilyn Owen was seen on rounds this AM. Her dyspnea is much improved and she feels safe to return home today.  She has a follow-up appointment in the Internal Medicine Center tomorrow where she will complete paperwork for the [facility] card.  She knows what she needs to bring.  If she can get the [facility] Card she may be able to get the very important inhaled steroids to keep her out of the hospital with future asthma exacerbations.  I agree with discharge home today on a steroid taper pending her getting the [employer] and a steroid inhaler.

## 2012-05-17 NOTE — Progress Notes (Signed)
Subjective:    No events overnight, ready for d/c this a.m.  Interval Events: None.   Objective:    Vital Signs:   Temp:  [98.3 F (36.8 C)-98.6 F (37 C)] 98.5 F (36.9 C) (05/29 0600) Pulse Rate:  [62-85] 85  (05/29 0600) Resp:  [18-19] 19  (05/29 0600) BP: (142-159)/(76-107) 159/93 mmHg (05/29 0600) SpO2:  [96 %-97 %] 97 % (05/29 0600) Last BM Date: 05/16/12  24-hour weight change: Weight change:   Intake/Output:  No intake or output data in the 24 hours ending 05/17/12 0846    Physical Exam: General: Vital signs reviewed and noted. Well-developed, well-nourished, in no acute distress; alert, appropriate and cooperative throughout examination.  Lungs:  Normal respiratory effort. Trace expiratory wheezing.  Heart: RRR. S1 and S2 normal without gallop, murmur, or rubs.  Abdomen:  BS normoactive. Soft, Nondistended, nontender.  No masses or organomegaly.  Extremities: No pretibial edema.     Labs:  Basic Metabolic Panel:  Lab 05/16/12 9604 05/14/12 0555 05/13/12 0610 05/12/12 0907 05/11/12 1607  NA 138 135 137 140 142  K 3.7 4.0 4.4 3.6 3.3*  CL 102 100 103 105 104  CO2 27 21 21 24 23   GLUCOSE 86 104* 115* 102* 161*  BUN 16 22 15 6 9   CREATININE 0.88 0.95 0.80 0.70 0.81  CALCIUM 9.3 10.1 10.0 -- --  MG -- -- 2.1 -- --  PHOS -- -- -- -- --    CBC:  Lab 05/11/12 1607  WBC 15.4*  NEUTROABS 14.9*  HGB 13.0  HCT 39.5  MCV 94.0  PLT 303    Cardiac Enzymes:  Lab 05/12/12 0321 05/11/12 1914  CKTOTAL 186* 153  CKMB 3.1 2.1  CKMBINDEX -- --  TROPONINI <0.30 <0.30    Imaging: No results found.     Medications:    Infusions:    Scheduled Medications:    . albuterol  2.5 mg Nebulization BID  . amLODipine  10 mg Oral Daily  . docusate sodium  100 mg Oral BID  . enoxaparin  40 mg Subcutaneous Q24H  . fluticasone  1 puff Inhalation BID  . pantoprazole  40 mg Oral Q1200  . pneumococcal 23 valent vaccine  0.5 mL Intramuscular Tomorrow-1000    . polyethylene glycol  17 g Oral Daily  . predniSONE  40 mg Oral Q breakfast  . DISCONTD: ipratropium  0.5 mg Nebulization BID  . DISCONTD: sodium chloride  3 mL Intravenous Q12H    PRN Medications: acetaminophen, albuterol, benzonatate, chlorpheniramine-HYDROcodone, phenol, traMADol-acetaminophen, DISCONTD: sodium chloride, DISCONTD: ketorolac, DISCONTD: sodium chloride   Assessment/ Plan:    Pt is a 46 y.o. yo female with a PMHx of uncontrolled baseline asthma and hypertension who was admitted on 05/11/2012 with symptoms of shortness of breath, which was determined to be secondary to asthma exacerbation, likely due to acute bronchitis. Interventions at this time will be focused on optimizing home regimen while treating her acute exacerbation.   # Asthma Exacerbation - in setting of acute bronchitis. Pt con't to improve, now only trace wheezing and intermittent cough.  HR finally down.  On PO steroids on bid nebs and PRN inhaler. - prednisone 40mg  PO daily, will taper at d/c - albuterol neb bid, albuterol inhaler q4 PRN - flovent bid for chronic control - toradol and ultracet for pain - tussionex and will add tessalon for cough - con't IS  # HTN - well controlled. - con't norvasc  # Hypokalemia - Resolved. On  admission, was due to excessive albuterol usage. - repleted  # Financial constraints -  Pt has no insurance, unable to afford inhalers in the past. Will need chronic inhalers. - CM will a/w obtaining flovent and albuterol - provided pt with info for orange card - has f/u appt in clinic on Thursday - will get to keep inhalers from this inpt stay as well  # DVT PPX - lovenox  # Dispo Today if rx for albuterol, flovent, and steroids can be resolved

## 2012-05-18 ENCOUNTER — Ambulatory Visit (INDEPENDENT_AMBULATORY_CARE_PROVIDER_SITE_OTHER): Payer: Self-pay | Admitting: Ophthalmology

## 2012-05-18 ENCOUNTER — Encounter: Payer: Self-pay | Admitting: Ophthalmology

## 2012-05-18 VITALS — BP 136/100 | HR 76 | Temp 97.2°F | Resp 23 | Ht 62.1 in | Wt 165.0 lb

## 2012-05-18 DIAGNOSIS — J45901 Unspecified asthma with (acute) exacerbation: Secondary | ICD-10-CM

## 2012-05-18 NOTE — Progress Notes (Signed)
Subjective:   Patient ID: Marilyn Owen female   DOB: [birth year] 46 y.o.   MRN: [id number]  HPI: Ms.Marilyn Owen is a 46 y.o. woman who presents for hospital follow up.  GERD- she describes having burning in chest after eating and burping type symptoms with sour taste.   Asthma- hasn't had any asthma in 7 years. Was using the albuterol 8-10 times a day. Was getting short of breath at work. Was on medicaid previously but mistake was made with financial statement so she lost medicaid. Has 2 flovent from hospital. Albuterol is $50 and flovent is $132.   Sinusitis- patient feels like she has sinus pressure and productive cough currently.   Past Medical History  Diagnosis Date  . Asthma   . Hypertension   . Murmur, cardiac   . Financial difficulties    Current Outpatient Prescriptions  Medication Sig Dispense Refill  . albuterol (PROVENTIL HFA;VENTOLIN HFA) 108 (90 BASE) MCG/ACT inhaler Inhale 2 puffs into the lungs every 4 (four) hours as needed. For wheezing.      [name] amLODipine (NORVASC) 10 MG tablet Take 1 tablet (10 mg total) by mouth daily.  30 tablet  5  . fluticasone (FLOVENT HFA) 44 MCG/ACT inhaler Inhale 2 puffs into the lungs 2 (two) times daily.  1 Inhaler  3  . predniSONE (DELTASONE) 10 MG tablet Take 1 tablet (10 mg total) by mouth daily. Take 4 pills for 1 day, 3 pills for 2 days, 2 pills for 2 days, 1 pill for 2 days, and stop.  30 tablet  0   No current facility-administered medications for this visit.   Facility-Administered Medications Ordered in Other Visits  Medication Dose Route Frequency Provider Last Rate Last Dose  . DISCONTD: acetaminophen (TYLENOL) tablet 650 mg  650 mg Oral Q6H PRN Maitri S Kalia-Reynolds, DO   650 mg at 05/12/12 0138  . DISCONTD: albuterol (PROVENTIL HFA;VENTOLIN HFA) 108 (90 BASE) MCG/ACT inhaler 2 puff  2 puff Inhalation Q4H PRN Daryel Gerald, MD      . DISCONTD: albuterol (PROVENTIL) (5 MG/ML) 0.5% nebulizer solution 2.5 mg  2.5  mg Nebulization BID Daryel Gerald, MD   2.5 mg at 05/17/12 4098  . DISCONTD: amLODipine (NORVASC) tablet 10 mg  10 mg Oral Daily Lars Mage, MD   10 mg at 05/17/12 0942  . DISCONTD: benzonatate (TESSALON) capsule 100 mg  100 mg Oral TID PRN Thompson Grayer Kalia-Reynolds, DO   100 mg at 05/16/12 1936  . DISCONTD: chlorpheniramine-HYDROcodone (TUSSIONEX) 10-8 MG/5ML suspension 5 mL  5 mL Oral Q12H PRN Maitri S Kalia-Reynolds, DO   5 mL at 05/17/12 1127  . DISCONTD: docusate sodium (COLACE) capsule 100 mg  100 mg Oral BID Daryel Gerald, MD   100 mg at 05/17/12 0942  . DISCONTD: enoxaparin (LOVENOX) injection 40 mg  40 mg Subcutaneous Q24H Lars Mage, MD   40 mg at 05/16/12 1823  . DISCONTD: fluticasone (FLOVENT HFA) 44 MCG/ACT inhaler 1 puff  1 puff Inhalation BID Lars Mage, MD   1 puff at 05/17/12 0919  . DISCONTD: pantoprazole (PROTONIX) EC tablet 40 mg  40 mg Oral Q1200 Daryel Gerald, MD   40 mg at 05/16/12 1113  . DISCONTD: phenol (CHLORASEPTIC) mouth spray 1 spray  1 spray Mouth/Throat PRN Manuela Schwartz, MD   1 spray at 05/15/12 2205  . DISCONTD: polyethylene glycol (MIRALAX / GLYCOLAX) packet 17 g  17 g Oral Daily Daryel Gerald, MD   [phone]  g at 05/16/12 1007  . DISCONTD: predniSONE (DELTASONE) tablet 40 mg  40 mg Oral Q breakfast Daryel Gerald, MD   40 mg at 05/17/12 0749  . DISCONTD: traMADol-acetaminophen (ULTRACET) 37.5-325 MG per tablet 1-2 tablet  1-2 tablet Oral Q6H PRN Priscella Mann, DO   1 tablet at 05/16/12 2145   No family history on file. History   Social History  . Marital Status: Single    Spouse Name: N/A    Number of Children: 4  . Years of Education: N/A   Social History Main Topics  . Smoking status: Never Smoker   . Smokeless tobacco: None  . Alcohol Use: No  . Drug Use: No  . Sexually Active:    Other Topics Concern  . None   Social History Narrative  . None    Objective:    Physical Exam: Filed Vitals:   05/18/12 0858  BP: 136/100  Pulse: 76  Temp: 97.2 F (36.2 C)  TempSrc: Oral  Resp: 23  Height: 5' 2.1" (1.577 m)  Weight: 165 lb (74.844 kg)  SpO2: 97%   General: young appearing woman sitting in wheelchair in no acute distress HEENT: PERRL, EOMI, no scleral icterus Pulm: very mild inspiratory wheeze bilaterally, reduced air movement in lower lung field posteriorly Ext: warm and well perfused, no pedal edema Neuro: alert and oriented X3, cranial nerves II-XII grossly intact  Assessment & Plan:

## 2012-05-18 NOTE — Assessment & Plan Note (Signed)
Patient has had several exacerbations in the past few months requiring one admission. She was started on flovent on discharge and is working to get approved for medicaid and the orange card. I told her we have advair samples in case she has not been approved in next two months. Asked her to use ranitidine daily to help reduce possible trigger for asthma. Could consider use flonase nasal spray once she gets drug coverage.

## 2012-05-18 NOTE — Patient Instructions (Signed)
-  Buy ranitidine 75mg  (generic Zantac) over the counter and take daily -Try using a sinus nasal wash or saline nasal spray to help with congestion

## 2012-05-19 ENCOUNTER — Encounter: Payer: Self-pay | Admitting: Licensed Clinical Social Worker

## 2012-05-19 NOTE — Progress Notes (Signed)
Marilyn Owen is known to this CSW as this is the second face-face contact CSW has had, initial was during her hospitalization.  CSW met with [name] and her mother, who resides in [place].  On today's visit, CSW assisted pt with printing out copies of her pay stubs to provide for application to [facility] orange card.  During this visit, [name] was able to speak with her Medicaid caseworker: Sharen Counter [phone].  Marilyn Owen was notified Medicaid office has up to 45 days before application is shown in system, currently pt's application is not in system.  Pt was also able to leave message for her Food/Nutritition case worker: L. Arlana Pouch [phone].  Marilyn Owen' food stamps have been reduced and will need to inquire as to when the issue will be resolved. Pt agreeable to information on local pantry and meal sites.  Pt has both the names and contact number for her workers.  As pt states she was having issues with housing, CSW provided [name] with information to the [employer] as an Administrator, arts for housing.  CSW provided local pantry and hot meal locations, and a bag of items from [facility] pantry.  Pt is aware CSW is available to assist as needed and has CSW contact information.

## 2013-02-17 ENCOUNTER — Emergency Department (HOSPITAL_COMMUNITY)
Admission: EM | Admit: 2013-02-17 | Discharge: 2013-02-17 | Disposition: A | Discharge status: Home / Self Care | Payer: Medicaid Other | Attending: Emergency Medicine | Admitting: Emergency Medicine

## 2013-02-17 ENCOUNTER — Encounter (HOSPITAL_COMMUNITY): Payer: Self-pay

## 2013-02-17 ENCOUNTER — Emergency Department (HOSPITAL_COMMUNITY): Payer: Medicaid Other

## 2013-02-17 DIAGNOSIS — I252 Old myocardial infarction: Secondary | ICD-10-CM | POA: Insufficient documentation

## 2013-02-17 DIAGNOSIS — I10 Essential (primary) hypertension: Secondary | ICD-10-CM | POA: Insufficient documentation

## 2013-02-17 DIAGNOSIS — W010XXA Fall on same level from slipping, tripping and stumbling without subsequent striking against object, initial encounter: Secondary | ICD-10-CM | POA: Insufficient documentation

## 2013-02-17 DIAGNOSIS — Y939 Activity, unspecified: Secondary | ICD-10-CM | POA: Insufficient documentation

## 2013-02-17 DIAGNOSIS — Y9229 Other specified public building as the place of occurrence of the external cause: Secondary | ICD-10-CM | POA: Insufficient documentation

## 2013-02-17 DIAGNOSIS — Z8679 Personal history of other diseases of the circulatory system: Secondary | ICD-10-CM | POA: Insufficient documentation

## 2013-02-17 DIAGNOSIS — S46911A Strain of unspecified muscle, fascia and tendon at shoulder and upper arm level, right arm, initial encounter: Secondary | ICD-10-CM

## 2013-02-17 DIAGNOSIS — Z79899 Other long term (current) drug therapy: Secondary | ICD-10-CM | POA: Insufficient documentation

## 2013-02-17 DIAGNOSIS — Z598 Other problems related to housing and economic circumstances: Secondary | ICD-10-CM | POA: Insufficient documentation

## 2013-02-17 DIAGNOSIS — J45909 Unspecified asthma, uncomplicated: Secondary | ICD-10-CM | POA: Insufficient documentation

## 2013-02-17 DIAGNOSIS — IMO0002 Reserved for concepts with insufficient information to code with codable children: Secondary | ICD-10-CM | POA: Insufficient documentation

## 2013-02-17 HISTORY — DX: Acute myocardial infarction, unspecified: I21.9

## 2013-02-17 MED ORDER — HYDROCODONE-ACETAMINOPHEN 5-325 MG PO TABS: 1.0000 | ORAL_TABLET | ORAL | Status: DC | PRN

## 2013-02-17 MED ORDER — IBUPROFEN 800 MG PO TABS: 800.0000 mg | ORAL_TABLET | Freq: Three times a day (TID) | ORAL | Status: DC

## 2013-02-17 MED ORDER — HYDROCODONE-ACETAMINOPHEN 5-325 MG PO TABS
1.0000 | ORAL_TABLET | Freq: Once | ORAL | Status: AC
  Administered 2013-02-17: 1 via ORAL
  Filled 2013-02-17: qty 1

## 2013-02-17 NOTE — ED Notes (Signed)
Pt returned from xray

## 2013-02-17 NOTE — ED Provider Notes (Signed)
Medical screening examination/treatment/procedure(s) were performed by non-physician practitioner and as supervising physician I was immediately available for consultation/collaboration.   Laray Anger, DO 02/17/13 2051

## 2013-02-17 NOTE — ED Provider Notes (Signed)
History     CSN: [id number]  Arrival date & time 02/17/13  1316   First MD Initiated Contact with Patient 02/17/13 1341      Chief Complaint  Patient presents with  . Fall  . Arm Pain    (Consider location/radiation/quality/duration/timing/severity/associated sxs/prior treatment) Patient is a 47 y.o. female presenting with fall and arm pain. The history is provided by the patient.  Fall The accident occurred 3 to 5 hours ago. The point of impact was the right shoulder. Pertinent negatives include no fever, no numbness and no headaches. Associated symptoms comments: She fell after slipping on wet surface at [facility] landing on her right shoulder. No head injury or neck pain. She has tingling into digits of right hand. No swelling. .  Arm Pain Pertinent negatives include no chills, fever, headaches, neck pain, numbness or weakness.    Past Medical History  Diagnosis Date  . Asthma   . Hypertension   . Murmur, cardiac   . Financial difficulties   . MI (myocardial infarction)     Past Surgical History  Procedure Laterality Date  . Appendectomy      History reviewed. No pertinent family history.  History  Substance Use Topics  . Smoking status: Never Smoker   . Smokeless tobacco: Not on file  . Alcohol Use: Yes     Comment: occ    OB History   Grav Para Term Preterm Abortions TAB SAB Ect Mult Living                  Review of Systems  Constitutional: Negative for fever and chills.  HENT: Negative.  Negative for neck pain.   Musculoskeletal:       See HPI.  Skin: Negative.  Negative for wound.  Neurological: Negative.  Negative for weakness, numbness and headaches.    Allergies  Benadryl; Penicillins; and Other  Home Medications   Current Outpatient Rx  Name  Route  Sig  Dispense  Refill  . albuterol (PROVENTIL HFA;VENTOLIN HFA) 108 (90 BASE) MCG/ACT inhaler   Inhalation   Inhale 2 puffs into the lungs every 4 (four) hours as needed. For wheezing.        [name] amLODipine (NORVASC) 10 MG tablet   Oral   Take 1 tablet (10 mg total) by mouth daily.   30 tablet   5   . fluticasone (FLOVENT HFA) 44 MCG/ACT inhaler   Inhalation   Inhale 2 puffs into the lungs 2 (two) times daily.   1 Inhaler   3     BP 164/106  Pulse 86  Temp(Src) 97.6 F (36.4 C) (Oral)  Resp 20  SpO2 99%  LMP 02/08/2013  Physical Exam  Constitutional: She is oriented to person, place, and time. She appears well-developed and well-nourished. No distress.  Neck: Normal range of motion.  Pulmonary/Chest: Effort normal.  Musculoskeletal:  No neck or paracervical tenderness. Right shoulder unremarkable in appearance without swelling, bruising or bony deformity. Tender over AC joint and deltoid. No scapular tenderness.  Neurological: She is alert and oriented to person, place, and time.  Skin: Skin is warm and dry.    ED Course  Procedures (including critical care time)  Labs Reviewed - No data to display No results found. Dg Shoulder Right  02/17/2013  *RADIOLOGY REPORT*  Clinical Data: Fall, arm pain, shoulder pain.  RIGHT SHOULDER - 2+ VIEW  Comparison: None  Findings: No acute bony abnormality.  Specifically, no fracture, subluxation, or dislocation.  Soft  tissues are intact. Joint spaces are maintained.  Normal bone mineralization.  IMPRESSION: No bony abnormality.   Original Report Authenticated By: Charlett Nose, M.D.     No diagnosis found. 1. Fall 2. Right shoulder strain    MDM  Uncomplicated shoulder strain injury.        Arnoldo Hooker, PA-C 02/17/13 1525

## 2013-02-17 NOTE — ED Notes (Signed)
Pt c/o diffuse right arm pain after fall today. Pt states arm is "tingly." No deformity noted, pt able to move arm.

## 2013-02-17 NOTE — ED Notes (Signed)
Pt transported to xray 

## 2013-02-17 NOTE — ED Notes (Signed)
Pt states she does blood pressure medication for high blood pressures and she did take her meds this morning

## 2013-02-17 NOTE — ED Notes (Signed)
Discharge instructions reviewed. Pt verbalized understanding.  

## 2013-02-22 ENCOUNTER — Other Ambulatory Visit (HOSPITAL_COMMUNITY): Payer: Self-pay | Admitting: Orthopedic Surgery

## 2013-02-22 DIAGNOSIS — M25511 Pain in right shoulder: Secondary | ICD-10-CM

## 2013-02-26 ENCOUNTER — Ambulatory Visit (HOSPITAL_COMMUNITY)
Admission: RE | Admit: 2013-02-26 | Discharge: 2013-02-26 | Disposition: A | Discharge status: Home / Self Care | Payer: Medicaid Other | Source: Ambulatory Visit | Attending: Orthopedic Surgery | Admitting: Orthopedic Surgery

## 2013-02-26 DIAGNOSIS — S4980XA Other specified injuries of shoulder and upper arm, unspecified arm, initial encounter: Secondary | ICD-10-CM | POA: Insufficient documentation

## 2013-02-26 DIAGNOSIS — W19XXXA Unspecified fall, initial encounter: Secondary | ICD-10-CM | POA: Insufficient documentation

## 2013-02-26 DIAGNOSIS — M25511 Pain in right shoulder: Secondary | ICD-10-CM

## 2013-02-26 DIAGNOSIS — S46909A Unspecified injury of unspecified muscle, fascia and tendon at shoulder and upper arm level, unspecified arm, initial encounter: Secondary | ICD-10-CM | POA: Insufficient documentation

## 2013-02-26 DIAGNOSIS — M25519 Pain in unspecified shoulder: Secondary | ICD-10-CM | POA: Insufficient documentation

## 2013-06-13 ENCOUNTER — Ambulatory Visit: Payer: No Typology Code available for payment source | Attending: Sports Medicine | Admitting: Physical Therapy

## 2013-06-13 DIAGNOSIS — M25519 Pain in unspecified shoulder: Secondary | ICD-10-CM | POA: Insufficient documentation

## 2013-06-13 DIAGNOSIS — M542 Cervicalgia: Secondary | ICD-10-CM | POA: Insufficient documentation

## 2013-06-13 DIAGNOSIS — IMO0001 Reserved for inherently not codable concepts without codable children: Secondary | ICD-10-CM | POA: Insufficient documentation

## 2013-06-13 DIAGNOSIS — W19XXXA Unspecified fall, initial encounter: Secondary | ICD-10-CM | POA: Insufficient documentation

## 2013-06-15 ENCOUNTER — Ambulatory Visit: Payer: No Typology Code available for payment source | Attending: Sports Medicine | Admitting: Physical Therapy

## 2013-06-15 DIAGNOSIS — IMO0001 Reserved for inherently not codable concepts without codable children: Secondary | ICD-10-CM | POA: Insufficient documentation

## 2013-06-15 DIAGNOSIS — M25519 Pain in unspecified shoulder: Secondary | ICD-10-CM | POA: Insufficient documentation

## 2013-06-15 DIAGNOSIS — W19XXXA Unspecified fall, initial encounter: Secondary | ICD-10-CM | POA: Insufficient documentation

## 2013-06-15 DIAGNOSIS — M542 Cervicalgia: Secondary | ICD-10-CM | POA: Insufficient documentation

## 2013-06-19 ENCOUNTER — Ambulatory Visit: Payer: No Typology Code available for payment source | Attending: Sports Medicine | Admitting: Physical Therapy

## 2013-06-19 DIAGNOSIS — R209 Unspecified disturbances of skin sensation: Secondary | ICD-10-CM | POA: Insufficient documentation

## 2013-06-19 DIAGNOSIS — M542 Cervicalgia: Secondary | ICD-10-CM | POA: Insufficient documentation

## 2013-06-19 DIAGNOSIS — IMO0001 Reserved for inherently not codable concepts without codable children: Secondary | ICD-10-CM | POA: Insufficient documentation

## 2013-06-19 DIAGNOSIS — M25519 Pain in unspecified shoulder: Secondary | ICD-10-CM | POA: Insufficient documentation

## 2013-06-21 ENCOUNTER — Ambulatory Visit: Payer: No Typology Code available for payment source | Attending: Sports Medicine | Admitting: Physical Therapy

## 2013-06-21 DIAGNOSIS — IMO0001 Reserved for inherently not codable concepts without codable children: Secondary | ICD-10-CM | POA: Insufficient documentation

## 2013-06-21 DIAGNOSIS — M25519 Pain in unspecified shoulder: Secondary | ICD-10-CM | POA: Insufficient documentation

## 2013-06-21 DIAGNOSIS — M542 Cervicalgia: Secondary | ICD-10-CM | POA: Insufficient documentation

## 2013-06-25 ENCOUNTER — Ambulatory Visit: Payer: No Typology Code available for payment source | Attending: Sports Medicine | Admitting: Physical Therapy

## 2013-06-25 DIAGNOSIS — IMO0001 Reserved for inherently not codable concepts without codable children: Secondary | ICD-10-CM | POA: Insufficient documentation

## 2013-06-25 DIAGNOSIS — M25519 Pain in unspecified shoulder: Secondary | ICD-10-CM | POA: Insufficient documentation

## 2013-06-25 DIAGNOSIS — M542 Cervicalgia: Secondary | ICD-10-CM | POA: Insufficient documentation

## 2013-06-29 ENCOUNTER — Ambulatory Visit: Payer: No Typology Code available for payment source | Attending: Sports Medicine | Admitting: Physical Therapy

## 2013-06-29 DIAGNOSIS — IMO0001 Reserved for inherently not codable concepts without codable children: Secondary | ICD-10-CM | POA: Insufficient documentation

## 2013-06-29 DIAGNOSIS — M542 Cervicalgia: Secondary | ICD-10-CM | POA: Insufficient documentation

## 2013-06-29 DIAGNOSIS — M25519 Pain in unspecified shoulder: Secondary | ICD-10-CM | POA: Insufficient documentation

## 2013-07-03 ENCOUNTER — Ambulatory Visit: Payer: No Typology Code available for payment source | Admitting: Physical Therapy

## 2013-07-05 ENCOUNTER — Ambulatory Visit: Payer: No Typology Code available for payment source | Attending: Sports Medicine | Admitting: Physical Therapy

## 2013-07-05 DIAGNOSIS — M542 Cervicalgia: Secondary | ICD-10-CM | POA: Insufficient documentation

## 2013-07-05 DIAGNOSIS — M25519 Pain in unspecified shoulder: Secondary | ICD-10-CM | POA: Insufficient documentation

## 2013-07-05 DIAGNOSIS — IMO0001 Reserved for inherently not codable concepts without codable children: Secondary | ICD-10-CM | POA: Insufficient documentation

## 2013-07-06 ENCOUNTER — Ambulatory Visit: Payer: No Typology Code available for payment source | Attending: Sports Medicine | Admitting: Physical Therapy

## 2013-07-06 DIAGNOSIS — IMO0001 Reserved for inherently not codable concepts without codable children: Secondary | ICD-10-CM | POA: Insufficient documentation

## 2013-07-06 DIAGNOSIS — M25519 Pain in unspecified shoulder: Secondary | ICD-10-CM | POA: Insufficient documentation

## 2013-07-06 DIAGNOSIS — M542 Cervicalgia: Secondary | ICD-10-CM | POA: Insufficient documentation

## 2013-07-10 ENCOUNTER — Ambulatory Visit: Payer: No Typology Code available for payment source | Attending: Sports Medicine | Admitting: Physical Therapy

## 2013-07-10 DIAGNOSIS — IMO0001 Reserved for inherently not codable concepts without codable children: Secondary | ICD-10-CM | POA: Insufficient documentation

## 2013-07-10 DIAGNOSIS — M542 Cervicalgia: Secondary | ICD-10-CM | POA: Insufficient documentation

## 2013-07-10 DIAGNOSIS — M25519 Pain in unspecified shoulder: Secondary | ICD-10-CM | POA: Insufficient documentation

## 2013-07-12 ENCOUNTER — Ambulatory Visit: Payer: No Typology Code available for payment source | Admitting: Physical Therapy

## 2013-07-16 ENCOUNTER — Ambulatory Visit: Payer: No Typology Code available for payment source | Attending: Sports Medicine | Admitting: Physical Therapy

## 2013-07-16 DIAGNOSIS — IMO0001 Reserved for inherently not codable concepts without codable children: Secondary | ICD-10-CM | POA: Insufficient documentation

## 2013-07-16 DIAGNOSIS — M25519 Pain in unspecified shoulder: Secondary | ICD-10-CM | POA: Insufficient documentation

## 2013-07-16 DIAGNOSIS — M542 Cervicalgia: Secondary | ICD-10-CM | POA: Insufficient documentation

## 2013-07-19 ENCOUNTER — Ambulatory Visit: Payer: No Typology Code available for payment source | Admitting: Physical Therapy

## 2013-07-20 ENCOUNTER — Ambulatory Visit: Payer: Medicaid Other | Attending: Sports Medicine | Admitting: Physical Therapy

## 2013-07-20 DIAGNOSIS — M542 Cervicalgia: Secondary | ICD-10-CM | POA: Insufficient documentation

## 2013-07-20 DIAGNOSIS — IMO0001 Reserved for inherently not codable concepts without codable children: Secondary | ICD-10-CM | POA: Insufficient documentation

## 2013-07-20 DIAGNOSIS — M25519 Pain in unspecified shoulder: Secondary | ICD-10-CM | POA: Insufficient documentation

## 2013-07-24 ENCOUNTER — Ambulatory Visit: Payer: Medicaid Other | Admitting: Physical Therapy

## 2013-07-26 ENCOUNTER — Ambulatory Visit: Payer: No Typology Code available for payment source | Attending: Sports Medicine | Admitting: Physical Therapy

## 2013-07-26 DIAGNOSIS — IMO0001 Reserved for inherently not codable concepts without codable children: Secondary | ICD-10-CM | POA: Insufficient documentation

## 2013-07-26 DIAGNOSIS — M25519 Pain in unspecified shoulder: Secondary | ICD-10-CM | POA: Insufficient documentation

## 2013-07-26 DIAGNOSIS — M542 Cervicalgia: Secondary | ICD-10-CM | POA: Insufficient documentation

## 2013-07-31 ENCOUNTER — Ambulatory Visit: Payer: No Typology Code available for payment source | Attending: Sports Medicine | Admitting: Rehabilitation

## 2013-07-31 DIAGNOSIS — M542 Cervicalgia: Secondary | ICD-10-CM | POA: Insufficient documentation

## 2013-07-31 DIAGNOSIS — M25519 Pain in unspecified shoulder: Secondary | ICD-10-CM | POA: Insufficient documentation

## 2013-07-31 DIAGNOSIS — IMO0001 Reserved for inherently not codable concepts without codable children: Secondary | ICD-10-CM | POA: Insufficient documentation

## 2013-08-02 ENCOUNTER — Ambulatory Visit: Payer: No Typology Code available for payment source | Admitting: Rehabilitation

## 2013-08-07 ENCOUNTER — Ambulatory Visit: Payer: Medicaid Other | Admitting: Physical Therapy

## 2013-08-09 ENCOUNTER — Ambulatory Visit: Payer: No Typology Code available for payment source | Attending: Sports Medicine | Admitting: Physical Therapy

## 2013-08-09 DIAGNOSIS — M25519 Pain in unspecified shoulder: Secondary | ICD-10-CM | POA: Insufficient documentation

## 2013-08-09 DIAGNOSIS — M542 Cervicalgia: Secondary | ICD-10-CM | POA: Insufficient documentation

## 2013-08-09 DIAGNOSIS — IMO0001 Reserved for inherently not codable concepts without codable children: Secondary | ICD-10-CM | POA: Insufficient documentation

## 2013-08-13 ENCOUNTER — Ambulatory Visit: Payer: No Typology Code available for payment source | Admitting: Physical Therapy

## 2013-08-16 ENCOUNTER — Ambulatory Visit: Payer: No Typology Code available for payment source | Attending: Sports Medicine | Admitting: Physical Therapy

## 2013-08-16 DIAGNOSIS — M25519 Pain in unspecified shoulder: Secondary | ICD-10-CM | POA: Insufficient documentation

## 2013-08-16 DIAGNOSIS — IMO0001 Reserved for inherently not codable concepts without codable children: Secondary | ICD-10-CM | POA: Insufficient documentation

## 2013-08-16 DIAGNOSIS — M542 Cervicalgia: Secondary | ICD-10-CM | POA: Insufficient documentation

## 2013-10-02 ENCOUNTER — Encounter (HOSPITAL_COMMUNITY): Payer: Self-pay | Admitting: Emergency Medicine

## 2013-10-02 DIAGNOSIS — Z5987 Material hardship due to limited financial resources, not elsewhere classified: Secondary | ICD-10-CM | POA: Insufficient documentation

## 2013-10-02 DIAGNOSIS — Z79899 Other long term (current) drug therapy: Secondary | ICD-10-CM | POA: Insufficient documentation

## 2013-10-02 DIAGNOSIS — Z880 Allergy status to penicillin: Secondary | ICD-10-CM | POA: Insufficient documentation

## 2013-10-02 DIAGNOSIS — G43909 Migraine, unspecified, not intractable, without status migrainosus: Secondary | ICD-10-CM | POA: Insufficient documentation

## 2013-10-02 DIAGNOSIS — I252 Old myocardial infarction: Secondary | ICD-10-CM | POA: Insufficient documentation

## 2013-10-02 DIAGNOSIS — Z9861 Coronary angioplasty status: Secondary | ICD-10-CM | POA: Insufficient documentation

## 2013-10-02 DIAGNOSIS — I10 Essential (primary) hypertension: Secondary | ICD-10-CM | POA: Insufficient documentation

## 2013-10-02 DIAGNOSIS — R011 Cardiac murmur, unspecified: Secondary | ICD-10-CM | POA: Insufficient documentation

## 2013-10-02 DIAGNOSIS — Z598 Other problems related to housing and economic circumstances: Secondary | ICD-10-CM | POA: Insufficient documentation

## 2013-10-02 DIAGNOSIS — J45909 Unspecified asthma, uncomplicated: Secondary | ICD-10-CM | POA: Insufficient documentation

## 2013-10-02 NOTE — ED Notes (Signed)
Pt. reports migraine headache with nausea and right facial tingling onset today .

## 2013-10-03 ENCOUNTER — Emergency Department (HOSPITAL_COMMUNITY)
Admission: EM | Admit: 2013-10-03 | Discharge: 2013-10-03 | Disposition: A | Discharge status: Home / Self Care | Payer: Medicaid Other | Source: Home / Self Care | Attending: Emergency Medicine | Admitting: Emergency Medicine

## 2013-10-03 ENCOUNTER — Emergency Department (HOSPITAL_COMMUNITY)
Admission: EM | Admit: 2013-10-03 | Discharge: 2013-10-03 | Disposition: A | Discharge status: Home / Self Care | Payer: Medicaid Other | Attending: Emergency Medicine | Admitting: Emergency Medicine

## 2013-10-03 ENCOUNTER — Encounter (HOSPITAL_COMMUNITY): Payer: Self-pay | Admitting: Emergency Medicine

## 2013-10-03 DIAGNOSIS — IMO0002 Reserved for concepts with insufficient information to code with codable children: Secondary | ICD-10-CM | POA: Insufficient documentation

## 2013-10-03 DIAGNOSIS — G43909 Migraine, unspecified, not intractable, without status migrainosus: Secondary | ICD-10-CM | POA: Insufficient documentation

## 2013-10-03 DIAGNOSIS — I10 Essential (primary) hypertension: Secondary | ICD-10-CM | POA: Insufficient documentation

## 2013-10-03 DIAGNOSIS — Z79899 Other long term (current) drug therapy: Secondary | ICD-10-CM | POA: Insufficient documentation

## 2013-10-03 DIAGNOSIS — R011 Cardiac murmur, unspecified: Secondary | ICD-10-CM | POA: Insufficient documentation

## 2013-10-03 DIAGNOSIS — Z880 Allergy status to penicillin: Secondary | ICD-10-CM | POA: Insufficient documentation

## 2013-10-03 DIAGNOSIS — J45909 Unspecified asthma, uncomplicated: Secondary | ICD-10-CM | POA: Insufficient documentation

## 2013-10-03 DIAGNOSIS — I252 Old myocardial infarction: Secondary | ICD-10-CM | POA: Insufficient documentation

## 2013-10-03 HISTORY — DX: Migraine, unspecified, not intractable, without status migrainosus: G43.909

## 2013-10-03 MED ORDER — SODIUM CHLORIDE 0.9 % IV BOLUS (SEPSIS)
1000.0000 mL | Freq: Once | INTRAVENOUS | Status: AC
  Administered 2013-10-03: 1000 mL via INTRAVENOUS

## 2013-10-03 MED ORDER — KETOROLAC TROMETHAMINE 30 MG/ML IJ SOLN
30.0000 mg | Freq: Once | INTRAMUSCULAR | Status: AC
  Administered 2013-10-03: 30 mg via INTRAVENOUS
  Filled 2013-10-03: qty 1

## 2013-10-03 MED ORDER — PROMETHAZINE HCL 25 MG/ML IJ SOLN
12.5000 mg | Freq: Once | INTRAMUSCULAR | Status: AC
  Administered 2013-10-03: 12.5 mg via INTRAVENOUS
  Filled 2013-10-03: qty 1

## 2013-10-03 MED ORDER — ONDANSETRON HCL 4 MG/2ML IJ SOLN
4.0000 mg | Freq: Once | INTRAMUSCULAR | Status: AC
  Administered 2013-10-03: 4 mg via INTRAVENOUS
  Filled 2013-10-03: qty 2

## 2013-10-03 MED ORDER — MAGNESIUM SULFATE 40 MG/ML IJ SOLN
2.0000 g | Freq: Once | INTRAMUSCULAR | Status: AC
  Administered 2013-10-03: 2 g via INTRAVENOUS
  Filled 2013-10-03: qty 50

## 2013-10-03 MED ORDER — DEXAMETHASONE SODIUM PHOSPHATE 10 MG/ML IJ SOLN
10.0000 mg | Freq: Once | INTRAMUSCULAR | Status: AC
  Administered 2013-10-03: 10 mg via INTRAVENOUS
  Filled 2013-10-03: qty 1

## 2013-10-03 NOTE — ED Provider Notes (Signed)
CSN: [id number]     Arrival date & time 10/02/13  2302 History   First MD Initiated Contact with Patient 10/03/13 0035     Chief Complaint  Patient presents with  . Migraine   (Consider location/radiation/quality/duration/timing/severity/associated sxs/prior Treatment) HPI History provided by patient. Headache onset tonight frontal with pain that radiates to the back of her head, associated aura, gradual onset and became severe, typical for her migraines. Some photophobia. Associated nausea but no vomiting. Pain somewhat better now in the ED 6/10. Throbbing in quality. No associated neck pain or neck stiffness. No fevers or chills. No recent travel or known sick contacts. No weakness or numbness. No difficulty with speech or gait.   Past Medical History  Diagnosis Date  . Asthma   . Hypertension   . Murmur, cardiac   . Financial difficulties   . MI (myocardial infarction)   . Migraine headache    Past Surgical History  Procedure Laterality Date  . Appendectomy    . Cardiac catheterization    . Tubal ligation      No family history on file. History  Substance Use Topics  . Smoking status: Never Smoker   . Smokeless tobacco: Not on file  . Alcohol Use: Yes     Comment: occ   OB History   Grav Para Term Preterm Abortions TAB SAB Ect Mult Living                 Review of Systems  Constitutional: Negative for fever and chills.  Eyes: Negative for pain.  Respiratory: Negative for shortness of breath.   Cardiovascular: Negative for chest pain.  Gastrointestinal: Negative for abdominal pain.  Genitourinary: Negative for dysuria.  Musculoskeletal: Negative for back pain, neck pain and neck stiffness.  Skin: Negative for rash.  Neurological: Positive for headaches. Negative for seizures, syncope and weakness.  All other systems reviewed and are negative.    Allergies  Benadryl; Penicillins; and Other  Home Medications   Current Outpatient Rx  Name  Route  Sig   Dispense  Refill  . albuterol (PROVENTIL HFA;VENTOLIN HFA) 108 (90 BASE) MCG/ACT inhaler   Inhalation   Inhale 2 puffs into the lungs every 4 (four) hours as needed. For wheezing.         [name] amLODipine (NORVASC) 10 MG tablet   Oral   Take 1 tablet (10 mg total) by mouth daily.   30 tablet   5   . traMADol (ULTRAM) 50 MG tablet   Oral   Take 50 mg by mouth every 6 (six) hours as needed for pain.          BP 169/106  Pulse 64  Temp(Src) 98.2 F (36.8 C) (Oral)  Resp 18  Ht 5\' 3"  (1.6 m)  Wt 167 lb (75.751 kg)  BMI 29.59 kg/m2  SpO2 100% Physical Exam  Constitutional: She is oriented to person, place, and time. She appears well-developed and well-nourished.  HENT:  Head: Normocephalic and atraumatic.  Eyes: EOM are normal. Pupils are equal, round, and reactive to light.  Neck: Neck supple.  No nuchal rigidity  Cardiovascular: Normal rate, regular rhythm and intact distal pulses.   Pulmonary/Chest: Effort normal and breath sounds normal. No respiratory distress.  Abdominal: Soft. She exhibits no distension. There is no tenderness.  Musculoskeletal: Normal range of motion. She exhibits no edema.  Neurological: She is alert and oriented to person, place, and time. She displays normal reflexes. No cranial nerve deficit. She exhibits normal  muscle tone.  Speech clear, no facial droop, gait intact  Skin: Skin is warm and dry.    ED Course  Procedures (including critical care time)  IV fluids. IV headache cocktail: Magnesium, toradol and Zofran  2:19 AM still some mild headache, ambulates to the bathroom, overall improving. Neuro exam unchanged.  Plan discharge home and close outpatient followup. Return precautions verbalized as understood.  MDM  Diagnosis: Migraine headache without neuro deficits  Improved with IV fluids and medications Vital signs nursing notes reviewed and considered  Sunnie Nielsen, MD 10/03/13 0230

## 2013-10-03 NOTE — ED Provider Notes (Signed)
CSN: [id number]     Arrival date & time 10/03/13  1259 History   First MD Initiated Contact with Patient 10/03/13 1307     Chief Complaint  Patient presents with  . Migraine   (Consider location/radiation/quality/duration/timing/severity/associated sxs/prior Treatment) HPI Patient presents to the emergency department with migraine headache.  The patient, states she was here earlier with her headache, but did not totally resolve and it came back.  The patient, states, that she has migraines, and this is typical for her migraine.  She states she also has photophobia, phonophobia, and nausea.  Patient, states, that she does not have any chest pain, shortness of breath, weakness, numbness, dizziness, blurred vision, fever, cough, or syncope.  Patient, states she did not take any medications prior to arrival Past Medical History  Diagnosis Date  . Asthma   . Hypertension   . Murmur, cardiac   . Financial difficulties   . MI (myocardial infarction)   . Migraine headache    Past Surgical History  Procedure Laterality Date  . Appendectomy    . Cardiac catheterization    . Tubal ligation      History reviewed. No pertinent family history. History  Substance Use Topics  . Smoking status: Never Smoker   . Smokeless tobacco: Not on file  . Alcohol Use: Yes     Comment: occ   OB History   Grav Para Term Preterm Abortions TAB SAB Ect Mult Living                 Review of Systems  Allergies  Benadryl; Other; and Penicillins  Home Medications   Current Outpatient Rx  Name  Route  Sig  Dispense  Refill  . albuterol (PROVENTIL HFA;VENTOLIN HFA) 108 (90 BASE) MCG/ACT inhaler   Inhalation   Inhale 2 puffs into the lungs every 4 (four) hours as needed. For wheezing.         [name] amLODipine (NORVASC) 10 MG tablet   Oral   Take 1 tablet (10 mg total) by mouth daily.   30 tablet   5   . fluticasone (FLOVENT HFA) 44 MCG/ACT inhaler   Inhalation   Inhale 2 puffs into the lungs 2  (two) times daily.         . traMADol (ULTRAM) 50 MG tablet   Oral   Take 50 mg by mouth every 6 (six) hours as needed for pain.          BP 146/109  Pulse 73  Temp(Src) 99.3 F (37.4 C) (Oral)  Resp 20  SpO2 97% Physical Exam  Nursing note and vitals reviewed. Constitutional: She is oriented to person, place, and time. She appears well-developed and well-nourished. No distress.  HENT:  Head: Normocephalic and atraumatic.  Eyes: EOM are normal. Pupils are equal, round, and reactive to light.  Neck: Normal range of motion. Neck supple.  Cardiovascular: Normal rate, regular rhythm and normal heart sounds.   Pulmonary/Chest: Effort normal and breath sounds normal.  Neurological: She is alert and oriented to person, place, and time. No sensory deficit. She exhibits normal muscle tone. Coordination and gait normal. GCS eye subscore is 4. GCS verbal subscore is 5. GCS motor subscore is 6.  Skin: Skin is warm and dry. No rash noted.    ED Course  Procedures (including critical care time) Patient was rechecked at 2:50 PM and her symptoms have resolved.  She stated that she feels much better.  States her stomach feels queasy because she  has not eaten since yesterday.  Patient be given something to be safe.  She is able to tolerate this.  The patient be sent home with followup with her primary care Dr. told to return here as needed    Carlyle Dolly, PA-C 10/03/13 1455

## 2013-10-03 NOTE — ED Notes (Signed)
Per pt sts migraine, nausea, vomiting. sts was here last night for the same.

## 2013-10-06 NOTE — ED Provider Notes (Signed)
Medical screening examination/treatment/procedure(s) were performed by non-physician practitioner and as supervising physician I was immediately available for consultation/collaboration.  Donnetta Hutching, MD 10/06/13 [phone]

## 2013-10-24 ENCOUNTER — Emergency Department (HOSPITAL_COMMUNITY): Payer: Medicaid Other

## 2013-10-24 ENCOUNTER — Emergency Department (HOSPITAL_COMMUNITY)
Admission: EM | Admit: 2013-10-24 | Discharge: 2013-10-24 | Disposition: A | Discharge status: Home / Self Care | Payer: Medicaid Other | Attending: Emergency Medicine | Admitting: Emergency Medicine

## 2013-10-24 ENCOUNTER — Encounter (HOSPITAL_COMMUNITY): Payer: Self-pay | Admitting: Emergency Medicine

## 2013-10-24 DIAGNOSIS — J45901 Unspecified asthma with (acute) exacerbation: Secondary | ICD-10-CM

## 2013-10-24 DIAGNOSIS — Z79899 Other long term (current) drug therapy: Secondary | ICD-10-CM | POA: Insufficient documentation

## 2013-10-24 DIAGNOSIS — H538 Other visual disturbances: Secondary | ICD-10-CM | POA: Insufficient documentation

## 2013-10-24 DIAGNOSIS — R110 Nausea: Secondary | ICD-10-CM | POA: Insufficient documentation

## 2013-10-24 DIAGNOSIS — Z87891 Personal history of nicotine dependence: Secondary | ICD-10-CM | POA: Insufficient documentation

## 2013-10-24 DIAGNOSIS — R011 Cardiac murmur, unspecified: Secondary | ICD-10-CM | POA: Insufficient documentation

## 2013-10-24 DIAGNOSIS — IMO0002 Reserved for concepts with insufficient information to code with codable children: Secondary | ICD-10-CM | POA: Insufficient documentation

## 2013-10-24 DIAGNOSIS — I10 Essential (primary) hypertension: Secondary | ICD-10-CM | POA: Insufficient documentation

## 2013-10-24 DIAGNOSIS — I252 Old myocardial infarction: Secondary | ICD-10-CM | POA: Insufficient documentation

## 2013-10-24 DIAGNOSIS — Z3202 Encounter for pregnancy test, result negative: Secondary | ICD-10-CM | POA: Insufficient documentation

## 2013-10-24 DIAGNOSIS — R42 Dizziness and giddiness: Secondary | ICD-10-CM | POA: Insufficient documentation

## 2013-10-24 DIAGNOSIS — J029 Acute pharyngitis, unspecified: Secondary | ICD-10-CM | POA: Insufficient documentation

## 2013-10-24 DIAGNOSIS — Z880 Allergy status to penicillin: Secondary | ICD-10-CM | POA: Insufficient documentation

## 2013-10-24 DIAGNOSIS — J40 Bronchitis, not specified as acute or chronic: Secondary | ICD-10-CM

## 2013-10-24 LAB — COMPREHENSIVE METABOLIC PANEL
AST: 15 U/L (ref 0–37)
Albumin: 4 g/dL (ref 3.5–5.2)
Alkaline Phosphatase: 61 U/L (ref 39–117)
BUN: 12 mg/dL (ref 6–23)
CO2: 23 mEq/L (ref 19–32)
Calcium: 9.2 mg/dL (ref 8.4–10.5)
Chloride: 105 mEq/L (ref 96–112)
Creatinine, Ser: 0.83 mg/dL (ref 0.50–1.10)
GFR calc non Af Amer: 84 mL/min — ABNORMAL LOW (ref >90)
Potassium: 3.8 mEq/L (ref 3.5–5.1)
Sodium: 139 mEq/L (ref 135–145)
Total Bilirubin: 0.3 mg/dL (ref 0.3–1.2)
Total Protein: 7.9 g/dL (ref 6.0–8.3)

## 2013-10-24 LAB — CBC WITH DIFFERENTIAL/PLATELET
Basophils Absolute: 0.1 10*3/uL (ref 0.0–0.1)
Basophils Relative: 1 % (ref 0–1)
Eosinophils Absolute: 0.1 10*3/uL (ref 0.0–0.7)
Eosinophils Relative: 2 % (ref 0–5)
HCT: 42.9 % (ref 36.0–46.0)
Hemoglobin: 14.7 g/dL (ref 12.0–15.0)
Lymphocytes Relative: 24 % (ref 12–46)
MCH: 31.7 pg (ref 26.0–34.0)
MCHC: 34.3 g/dL (ref 30.0–36.0)
Monocytes Absolute: 0.6 10*3/uL (ref 0.1–1.0)
Monocytes Relative: 11 % (ref 3–12)
Neutro Abs: 3.4 10*3/uL (ref 1.7–7.7)
Neutrophils Relative %: 61 % (ref 43–77)
Platelets: 233 10*3/uL (ref 150–400)
WBC: 5.6 10*3/uL (ref 4.0–10.5)

## 2013-10-24 LAB — URINALYSIS, ROUTINE W REFLEX MICROSCOPIC
Bilirubin Urine: NEGATIVE
Glucose, UA: NEGATIVE mg/dL
Ketones, ur: 15 mg/dL — AB
Protein, ur: NEGATIVE mg/dL
pH: 6 (ref 5.0–8.0)

## 2013-10-24 LAB — URINE MICROSCOPIC-ADD ON

## 2013-10-24 MED ORDER — ALBUTEROL SULFATE HFA 108 (90 BASE) MCG/ACT IN AERS: 2.0000 | INHALATION_SPRAY | RESPIRATORY_TRACT | Status: DC | PRN

## 2013-10-24 MED ORDER — ALBUTEROL SULFATE HFA 108 (90 BASE) MCG/ACT IN AERS
1.0000 | INHALATION_SPRAY | Freq: Once | RESPIRATORY_TRACT | Status: AC
  Administered 2013-10-24: 1 via RESPIRATORY_TRACT
  Filled 2013-10-24: qty 6.7

## 2013-10-24 MED ORDER — ALBUTEROL SULFATE (5 MG/ML) 0.5% IN NEBU
5.0000 mg | INHALATION_SOLUTION | Freq: Once | RESPIRATORY_TRACT | Status: AC
  Administered 2013-10-24: 5 mg via RESPIRATORY_TRACT
  Filled 2013-10-24: qty 1

## 2013-10-24 MED ORDER — METHYLPREDNISOLONE SODIUM SUCC 125 MG IJ SOLR
125.0000 mg | Freq: Once | INTRAMUSCULAR | Status: AC
  Administered 2013-10-24: 125 mg via INTRAVENOUS
  Filled 2013-10-24: qty 2

## 2013-10-24 MED ORDER — SODIUM CHLORIDE 0.9 % IV BOLUS (SEPSIS)
1000.0000 mL | Freq: Once | INTRAVENOUS | Status: AC
  Administered 2013-10-24: 1000 mL via INTRAVENOUS

## 2013-10-24 MED ORDER — PREDNISONE 20 MG PO TABS: ORAL_TABLET | ORAL | Status: DC

## 2013-10-24 NOTE — ED Notes (Signed)
Pt ambulated to the bathroom to give urine sample. 

## 2013-10-24 NOTE — ED Notes (Signed)
Dr. Zavitz at the bedside.  

## 2013-10-24 NOTE — ED Notes (Signed)
Pt transported to CT ?

## 2013-10-24 NOTE — ED Notes (Signed)
MD notified of Pt blood pressure, pt advised to follow up with [facility] for follow up on BP medications.

## 2013-10-24 NOTE — ED Provider Notes (Signed)
CSN: [id number]     Arrival date & time 10/24/13  4098 History   First MD Initiated Contact with Patient 10/24/13 0901     Chief Complaint  Patient presents with  . Shortness of Breath  . Cough   (Consider location/radiation/quality/duration/timing/severity/associated sxs/prior Treatment) The history is provided by the patient. No language interpreter was used.  Marilyn Owen is a 47 y/o F with PMHx of asthma, HTN, MI presenting to the ED with shortness of breath and cough is been ongoing for the past week. As per patient, reported that last week she is experiencing flulike symptoms-generalized bodyaches and fever ranging anywhere from 101 202F. Patient reports that she had an asthma attack approximately 5:00 AM this morning. Patient reported that she try to use her inhaler, reported that [name] was on 0. Patient reports she developed a cough on Sunday, reported it is a productive cough of clear mucus. Patient reports that she ran out of her inhaler on Sunday. Patient reports she's been experiencing dizziness. Reported that she's been experiencing blurred vision, reported blurred vision has been ongoing for the past month but, has been gotten more intense. Patient reports she's been expressing chest tightness, described as discomfort underneath her breasts bilaterally. Reported that she's been having some difficulty breathing and shortness of breath. Reported nausea associated with the symptoms. Reported that she's been having mild sore throat as well as nasal congestion. Denied chest pain, neck pain, neck stiffness, vomiting, diarrhea, abdominal pain, back pain, ear pain. Denied recent travel. [facility] reported that she tried to get an appointment today, [facility].  Past Medical History  Diagnosis Date  . Asthma   . Hypertension   . Murmur, cardiac   . Financial difficulties   . MI (myocardial infarction)   . Migraine headache     Past Surgical History  Procedure Laterality Date  . Appendectomy    . Cardiac catheterization    . Tubal ligation      History reviewed. No pertinent family history. History  Substance Use Topics  . Smoking status: Former Games developer  . Smokeless tobacco: Not on file  . Alcohol Use: Yes     Comment: occ   OB History   Grav Para Term Preterm Abortions TAB SAB Ect Mult Living                 Review of Systems  Constitutional: Positive for fever. Negative for chills.  HENT: Positive for sore throat. Negative for trouble swallowing.   Eyes: Positive for visual disturbance (blurred vision bilaterally).  Respiratory: Positive for cough, chest tightness, shortness of breath and wheezing.   Cardiovascular: Negative for chest pain.  Gastrointestinal: Positive for nausea. Negative for vomiting and abdominal pain.  Musculoskeletal: Negative for back pain, neck pain and neck stiffness.  Neurological: Positive for dizziness. Negative for weakness and numbness.  All other systems reviewed and are negative.    Allergies  Benadryl; Other; and Penicillins  Home Medications   Current Outpatient Rx  Name  Route  Sig  Dispense  Refill  . albuterol (PROVENTIL HFA;VENTOLIN HFA) 108 (90 BASE) MCG/ACT inhaler   Inhalation   Inhale 2 puffs into the lungs every 4 (four) hours as needed. For wheezing.         [name] amLODipine (NORVASC) 10 MG tablet   Oral   Take 1 tablet (10 mg total) by mouth daily.   30 tablet   5   . fluticasone (  FLOVENT HFA) 44 MCG/ACT inhaler   Inhalation   Inhale 2 puffs into the lungs 2 (two) times daily.         . Iron-Vitamins (GERITOL TONIC PO)   Oral   Take 15 mLs by mouth daily.         [name] Phenylephrine-Pheniramine-DM (THERAFLU COLD & COUGH PO)   Oral   Take 1 Package by mouth daily as needed (for flu symptoms).         . traMADol (ULTRAM) 50 MG tablet   Oral   Take 50 mg by mouth every 6 (six) hours as needed for pain.         [name] albuterol  (PROVENTIL HFA;VENTOLIN HFA) 108 (90 BASE) MCG/ACT inhaler   Inhalation   Inhale 2 puffs into the lungs every 4 (four) hours as needed for wheezing or shortness of breath.   1 Inhaler   1   . predniSONE (DELTASONE) 20 MG tablet      2 tabs po daily x 4 days   8 tablet   0    BP 143/94  Pulse 79  Temp(Src) 98.6 F (37 C) (Oral)  Resp 20  Ht 5\' 2"  (1.575 m)  Wt 160 lb (72.576 kg)  BMI 29.26 kg/m2  SpO2 98%  LMP 10/17/2013 Physical Exam  Nursing note and vitals reviewed. Constitutional: She is oriented to person, place, and time. She appears well-developed and well-nourished. No distress.  HENT:  Head: Normocephalic and atraumatic.  Mouth/Throat: Oropharynx is clear and moist. No oropharyngeal exudate.  Mild erythema noted to the posterior oropharynx - negative petechiae, exudate, swelling noted. Uvula midline, symmetrical elevation noted.   Eyes: Conjunctivae and EOM are normal. Pupils are equal, round, and reactive to light. Right eye exhibits no discharge. Left eye exhibits no discharge.  Neck: Normal range of motion. Neck supple. No tracheal deviation present.  Negative neck stiffness Negative nuchal rigidity Negative cervical LAD  Cardiovascular: Normal rate, regular rhythm and normal heart sounds.  Exam reveals no friction rub.   No murmur heard. Pulses:      Radial pulses are 2+ on the right side, and 2+ on the left side.       Dorsalis pedis pulses are 2+ on the right side, and 2+ on the left side.  Pulmonary/Chest: Effort normal. No respiratory distress. She has wheezes. She has no rales. She exhibits no tenderness.  Expiratory wheezes noted to the upper lobes bilaterally Inspiratory and expiratory wheezes noted to the lower lobes bilaterally Negative respiratory distress noted - patient able to speak in full sentences without difficulty or gasping for air.   Musculoskeletal: Normal range of motion.  Lymphadenopathy:    She has no cervical adenopathy.   Neurological: She is alert and oriented to person, place, and time. She exhibits normal muscle tone. Coordination normal.  Skin: Skin is warm and dry. No rash noted. She is not diaphoretic. No erythema.  Psychiatric: She has a normal mood and affect. Her behavior is normal. Thought content normal.    ED Course  Procedures (including critical care time)  1:37 PM This provider re-assessed patient and went over lab and imaging findings. Patient reported that she is feeling better and that she is breathing more easily. Re-assessed lungs and clear to auscultation bilaterally, negative wheezing noted. Patient in no respiratory distress.    Date: 10/24/2013  Rate: 81  Rhythm: normal sinus rhythm  QRS Axis: normal  Intervals: normal  ST/T Wave abnormalities: normal  Conduction Disutrbances:none  Narrative Interpretation:   Old EKG Reviewed: unchanged EKG analyzed and reviewed by this provider and attending physician.   Labs Review Labs Reviewed  COMPREHENSIVE METABOLIC PANEL - Abnormal; Notable for the following:    GFR calc non Af Amer 84 (*)    All other components within normal limits  URINALYSIS, ROUTINE W REFLEX MICROSCOPIC - Abnormal; Notable for the following:    APPearance HAZY (*)    Ketones, ur 15 (*)    Leukocytes, UA MODERATE (*)    All other components within normal limits  URINE MICROSCOPIC-ADD ON - Abnormal; Notable for the following:    Squamous Epithelial / LPF MANY (*)    Bacteria, UA FEW (*)    All other components within normal limits  CBC WITH DIFFERENTIAL  TROPONIN I  POCT PREGNANCY, URINE   Imaging Review Dg Chest 2 View  10/24/2013   CLINICAL DATA:  Right-sided chest pain.  EXAM: CHEST  2 VIEW  COMPARISON:  05/11/2012.  FINDINGS: The heart size and mediastinal contours are within normal limits. Both lungs are clear. The visualized skeletal structures are unremarkable.  IMPRESSION: No active cardiopulmonary disease.   Electronically Signed   By: Salome Holmes M.D.   On: 10/24/2013 11:36   Ct Head Wo Contrast  10/24/2013   CLINICAL DATA:  Pt c/o of shortness of breath since Sunday and a cough that began on Thursday. Pt states she is out of her inhaler medication since Sunday.  EXAM: CT HEAD WITHOUT CONTRAST  TECHNIQUE: Contiguous axial images were obtained from the base of the skull through the vertex without intravenous contrast.  COMPARISON:  None.  FINDINGS: There is no evidence of mass effect, midline shift or extra-axial fluid collections. There is no evidence of a space-occupying lesion or intracranial hemorrhage. There is no evidence of a cortical-based area of acute infarction. There are large calcified parafalcine areas which may represent calcified meningioma is along the anterior falx.  The ventricles and sulci are appropriate for the patient's age. The basal cisterns are patent.  Visualized portions of the orbits are unremarkable. The visualized portions of the paranasal sinuses and mastoid air cells are unremarkable.  The osseous structures are unremarkable. There is leftward deviation of the nasal septum.  IMPRESSION: No acute intracranial pathology.   Electronically Signed   By: Elige Ko   On: 10/24/2013 13:31    EKG Interpretation     Ventricular Rate:  81 PR Interval:  125 QRS Duration: 69 QT Interval:  380 QTC Calculation: 441 R Axis:   76 Text Interpretation:  Sinus rhythm Similar to previous, flattened T waves.            MDM   1. Bronchitis   2. Acute asthma exacerbation    Patient presenting to the ED with shortness of breath and cough that has been ongoing for the past week. Patient reported that she woke up this morning with an asthma attack. Patient reported that she ran out of her albuterol inhaler on Sunday.  Alert and oriented. Heart rate and rhythm normal. Pulses palpable and strong, DP and radial bilaterally. Lungs noted expiratory wheezes to the upper lobes bilaterally with inspiratory and expiratory  wheezes noted to the lower lobes bilaterally. Patient in no acute distress. Patient able to speak in full sentences without difficulty noted.  EKG negative ischemic findings noted, no new changes - flattened T waves. CBC negative findings. CMP negative findings noted. UA noted moderate leukocytes with many squamous cells - poor  clean catch. Urine pregnancy negative. Troponin negative elevation. CT head negative findings noted - no acute intracranial abnormalities. Chest xray no acute cardiopulmonary diseases noted.  Patient given albuterol treatment via nebulizer - patient breathing a lot more easily after the treat - negative wheezes auscultated. IV fluids administered and IV solumedrol administered to patient. Blood pressure controlled in ED setting.  Patient stable, afebrile. Doubt ACS. Doubt PE. Suspicion to be asthma with bronchitis and URI. Discharged patient with albuterol inhaler and prednisone tapering. Discussed with patient to rest and stay hydrated. Discussed with patient to avoid any strenuous activity. Discussed with patient that she needs to continue to monitor her blood pressure and to continue to take her medications as prescribed - discussed with patient to [facility]. Discussed with patient to continue to monitor symptoms and if symptoms are to worsen or change to report back to the ED - strict return instructions given. Patient agree to plan of care, understood, all questions answered.   Raymon Mutton, PA-C 10/24/13 [phone]

## 2013-10-24 NOTE — ED Notes (Addendum)
Pt c/o of shortness of breath since Sunday and a cough that began on Thursday.  Pt states she is out of her inhaler medication since Sunday.  Pt states [facility] is unable to see her until the 20th of November.  Pt has O2 of 100% and a strong cough.  Pt states she is coughing up clear material, pt states she feels short of breath.

## 2013-10-25 NOTE — ED Provider Notes (Signed)
Medical screening examination/treatment/procedure(s) were conducted as a shared visit with non-physician practitioner(s) or resident and myself. I personally evaluated the patient during the encounter and agree with the findings and plan unless otherwise indicated.  I have personally reviewed any xrays and/ or EKG's with the provider and I agree with interpretation.  Cough, wheezing and sob worsening the past week. No sick contacts. No current steroids or abx.  Exam exp wheeze bilateral, mild tachypnea. BP initially high, improved on recheck. Pt on bp meds and has fup for bp.  Pt improved with nebs in ED. CXR no acute findings.  DC  Acute asthma exac, Bronchitis   Enid Skeens, MD 10/25/13 1718

## 2013-11-08 ENCOUNTER — Ambulatory Visit: Payer: No Typology Code available for payment source | Attending: Internal Medicine | Admitting: Internal Medicine

## 2013-11-08 VITALS — BP 124/91 | HR 68 | Temp 98.3°F | Resp 16 | Ht 62.0 in | Wt 167.0 lb

## 2013-11-08 DIAGNOSIS — I10 Essential (primary) hypertension: Secondary | ICD-10-CM

## 2013-11-08 DIAGNOSIS — J45901 Unspecified asthma with (acute) exacerbation: Secondary | ICD-10-CM

## 2013-11-08 DIAGNOSIS — Z23 Encounter for immunization: Secondary | ICD-10-CM

## 2013-11-08 LAB — POCT GLYCOSYLATED HEMOGLOBIN (HGB A1C): Hemoglobin A1C: 5

## 2013-11-08 LAB — TSH: TSH: 0.565 u[IU]/mL (ref 0.350–4.500)

## 2013-11-08 LAB — LIPID PANEL
Cholesterol: 183 mg/dL (ref 0–200)
Triglycerides: 51 mg/dL (ref <150)

## 2013-11-08 MED ORDER — ALBUTEROL SULFATE HFA 108 (90 BASE) MCG/ACT IN AERS: 2.0000 | INHALATION_SPRAY | RESPIRATORY_TRACT | Status: DC | PRN

## 2013-11-08 MED ORDER — BECLOMETHASONE DIPROPIONATE 80 MCG/ACT IN AERS: 2.0000 | INHALATION_SPRAY | Freq: Two times a day (BID) | RESPIRATORY_TRACT | Status: DC

## 2013-11-08 MED ORDER — ALBUTEROL SULFATE HFA 108 (90 BASE) MCG/ACT IN AERS: 2.0000 | INHALATION_SPRAY | Freq: Four times a day (QID) | RESPIRATORY_TRACT | Status: DC | PRN

## 2013-11-08 MED ORDER — FLUTICASONE PROPIONATE HFA 44 MCG/ACT IN AERO: 2.0000 | INHALATION_SPRAY | Freq: Two times a day (BID) | RESPIRATORY_TRACT | Status: DC

## 2013-11-08 MED ORDER — ALBUTEROL SULFATE (2.5 MG/3ML) 0.083% IN NEBU: 2.5000 mg | INHALATION_SOLUTION | Freq: Four times a day (QID) | RESPIRATORY_TRACT | Status: DC | PRN

## 2013-11-08 MED ORDER — AMLODIPINE BESYLATE 10 MG PO TABS: 10.0000 mg | ORAL_TABLET | Freq: Every day | ORAL | Status: DC

## 2013-11-08 MED ORDER — TRAMADOL HCL 50 MG PO TABS: 50.0000 mg | ORAL_TABLET | Freq: Four times a day (QID) | ORAL | Status: DC | PRN

## 2013-11-08 NOTE — Progress Notes (Signed)
Pt is here to establish care. Pt is here to manage her HTN and her asthma with exacerbation.

## 2013-11-08 NOTE — Progress Notes (Signed)
Patient ID: Marilyn Owen, female   DOB: 1966/09/13, 47 y.o.   MRN: [id number]  CC: new pt  HPI: 47 year old female with past medical history of asthma, hypertension who presented to the clinic for follow up after hospitalization for pneumonia. She is doing great this am. No shortness of breath, no chest pain, no palpitations.   Allergies  Allergen Reactions  . Benadryl [Diphenhydramine Hcl] Anaphylaxis  . Other Anaphylaxis and Swelling    Bananas and Almonds.   . Penicillins Anaphylaxis   Past Medical History  Diagnosis Date  . Asthma   . Hypertension   . Murmur, cardiac   . Financial difficulties   . MI (myocardial infarction)   . Migraine headache    Current Outpatient Prescriptions on File Prior to Visit  Medication Sig Dispense Refill  . Iron-Vitamins (GERITOL TONIC PO) Take 15 mLs by mouth daily.       No current facility-administered medications on file prior to visit.   Asthma in family.   History   Social History  . Marital Status: Single    Spouse Name: N/A    Number of Children: 4  . Years of Education: N/A   Occupational History  . Not on file.   Social History Main Topics  . Smoking status: Former Games developer  . Smokeless tobacco: Not on file  . Alcohol Use: Yes     Comment: occ  . Drug Use: Yes    Special: Marijuana  . Sexual Activity: Not on file   Other Topics Concern  . Not on file   Social History Narrative  . No narrative on file    Review of Systems  Constitutional: Negative for fever, chills, diaphoresis, activity change, appetite change and fatigue.  HENT: Negative for ear pain, nosebleeds, congestion, facial swelling, rhinorrhea, neck pain, neck stiffness and ear discharge.   Eyes: Negative for pain, discharge, redness, itching and visual disturbance.  Respiratory: Negative for cough, choking, chest tightness, shortness of breath, wheezing and stridor.   Cardiovascular: Negative for chest pain, palpitations and leg swelling.   Gastrointestinal: Negative for abdominal distention.  Genitourinary: Negative for dysuria, urgency, frequency, hematuria, flank pain, decreased urine volume, difficulty urinating and dyspareunia.  Musculoskeletal: Negative for back pain, joint swelling, arthralgias and gait problem.  Neurological: Negative for dizziness, tremors, seizures, syncope, facial asymmetry, speech difficulty, weakness, light-headedness, numbness and headaches.  Hematological: Negative for adenopathy. Does not bruise/bleed easily.  Psychiatric/Behavioral: Negative for hallucinations, behavioral problems, confusion, dysphoric mood, decreased concentration and agitation.    Objective:  There were no vitals filed for this visit.  Physical Exam  Constitutional: Appears well-developed and well-nourished. No distress.  HENT: Normocephalic. External right and left ear normal. Oropharynx is clear and moist.  Eyes: Conjunctivae and EOM are normal. PERRLA, no scleral icterus.  Neck: Normal ROM. Neck supple. No JVD. No tracheal deviation. No thyromegaly.  CVS: RRR, S1/S2 +, no murmurs, no gallops, no carotid bruit.  Pulmonary: Effort and breath sounds normal, no stridor, rhonchi, wheezes, rales.  Abdominal: Soft. BS +,  no distension, tenderness, rebound or guarding.  Musculoskeletal: Normal range of motion. No edema and no tenderness.  Lymphadenopathy: No lymphadenopathy noted, cervical, inguinal. Neuro: Alert. Normal reflexes, muscle tone coordination. No cranial nerve deficit. Skin: Skin is warm and dry. No rash noted. Not diaphoretic. No erythema. No pallor.  Psychiatric: Normal mood and affect. Behavior, judgment, thought content normal.   Lab Results  Component Value Date   WBC 5.6 10/24/2013   HGB 14.7  10/24/2013   HCT 42.9 10/24/2013   MCV 92.7 10/24/2013   PLT 233 10/24/2013   Lab Results  Component Value Date   CREATININE 0.83 10/24/2013   BUN 12 10/24/2013   NA 139 10/24/2013   K 3.8 10/24/2013   CL 105  10/24/2013   CO2 23 10/24/2013   No results found for this basename: HGBA1C   Lipid Panel     Component Value Date/Time   CHOL Value: 155               08/18/2010 0513   TRIG 63 08/18/2010 0513   HDL 62 08/18/2010 0513   CHOLHDL 2.5 08/18/2010 0513   VLDL 13 08/18/2010 0513   LDLCALC Value: 80        08/18/2010 0513       Assessment and plan:   Patient Active Problem List   Diagnosis Date Noted  . Extrinsic asthma with exacerbation 05/11/2012    Priority: Medium - may continue to use Flovent and Albuterol   . HYPERTENSION 09/22/2010    Priority: Medium - continue norvasc

## 2013-11-08 NOTE — Addendum Note (Signed)
Addended by: Alison Murray on: 11/08/2013 11:35 AM   Modules accepted: Orders, Medications

## 2013-11-08 NOTE — Patient Instructions (Signed)
Asthma, Adult Asthma is a recurring condition in which the airways tighten and narrow. Asthma can make it difficult to breathe. It can cause coughing, wheezing, and shortness of breath. Asthma episodes (also called asthma attacks) range from minor to life-threatening. Asthma cannot be cured, but medicines and lifestyle changes can help control it. CAUSES Asthma is believed to be caused by inherited (genetic) and environmental factors, but its exact cause is unknown. Asthma may be triggered by allergens, lung infections, or irritants in the air. Asthma triggers are different for each person. Common triggers include:   Animal dander.  Dust mites.  Cockroaches.  Pollen from trees or grass.  Mold.  Smoke.  Air pollutants such as dust, household cleaners, hair sprays, aerosol sprays, paint fumes, strong chemicals, or strong odors.  Cold air, weather changes, and winds (which increase molds and pollens in the air).  Strong emotional expressions such as crying or laughing hard.  Stress.  Certain medicines (such as aspirin) or types of drugs (such as beta-blockers).  Sulfites in foods and drinks. Foods and drinks that may contain sulfites include dried fruit, potato chips, and sparkling grape juice.  Infections or inflammatory conditions such as the flu, a cold, or an inflammation of the nasal membranes (rhinitis).  Gastroesophageal reflux disease (GERD).  Exercise or strenuous activity. SYMPTOMS Symptoms may occur immediately after asthma is triggered or many hours later. Symptoms include:  Wheezing.  Excessive nighttime or early morning coughing.  Frequent or severe coughing with a common cold.  Chest tightness.  Shortness of breath. DIAGNOSIS  The diagnosis of asthma is made by a review of your medical history and a physical exam. Tests may also be performed. These may include:  Lung function studies. These tests show how much air you breath in and out.  Allergy  tests.  Imaging tests such as X-rays. TREATMENT  Asthma cannot be cured, but it can usually be controlled. Treatment involves identifying and avoiding your asthma triggers. It also involves medicines. There are 2 classes of medicine used for asthma treatment:   Controller medicines. These prevent asthma symptoms from occurring. They are usually taken every day.  Reliever or rescue medicines. These quickly relieve asthma symptoms. They are used as needed and provide short-term relief. Your health care provider will help you create an asthma action plan. An asthma action plan is a written plan for managing and treating your asthma attacks. It includes a list of your asthma triggers and how they may be avoided. It also includes information on when medicines should be taken and when their dosage should be changed. An action plan may also involve the use of a device called a peak flow meter. A peak flow meter measures how well the lungs are working. It helps you monitor your condition. HOME CARE INSTRUCTIONS   Take medicine as directed by your health care provider. Speak with your health care provider if you have questions about how or when to take the medicines.  Use a peak flow meter as directed by your health care provider. Record and keep track of readings.  Understand and use the action plan to help minimize or stop an asthma attack without needing to seek medical care.  Control your home environment in the following ways to help prevent asthma attacks:  Do not smoke. Avoid being exposed to secondhand smoke.  Change your heating and air conditioning filter regularly.  Limit your use of fireplaces and wood stoves.  Get rid of pests (such as roaches and   mice) and their droppings.  Throw away plants if you see mold on them.  Clean your floors and dust regularly. Use unscented cleaning products.  Try to have someone else vacuum for you regularly. Stay out of rooms while they are being  vacuumed and for a short while afterward. If you vacuum, use a dust mask from a hardware store, a double-layered or microfilter vacuum cleaner bag, or a vacuum cleaner with a HEPA filter.  Replace carpet with wood, tile, or vinyl flooring. Carpet can trap dander and dust.  Use allergy-proof pillows, mattress covers, and box spring covers.  Wash bed sheets and blankets every week in hot water and dry them in a dryer.  Use blankets that are made of polyester or cotton.  Clean bathrooms and kitchens with bleach. If possible, have someone repaint the walls in these rooms with mold-resistant paint. Keep out of the rooms that are being cleaned and painted.  Wash hands frequently. SEEK MEDICAL CARE IF:   You have wheezing, shortness of breath, or a cough even if taking medicine to prevent attacks.  The colored mucus you cough up (sputum) is thicker than usual.  Your sputum changes from clear or white to yellow, green, gray, or bloody.  You have any problems that may be related to the medicines you are taking (such as a rash, itching, swelling, or trouble breathing).  You are using a reliever medicine more than 2 3 times per week.  Your peak flow is still at 50 79% of you personal best after following your action plan for 1 hour. SEEK IMMEDIATE MEDICAL CARE IF:   You seem to be getting worse and are unresponsive to treatment during an asthma attack.  You are short of breath even at rest.  You get short of breath when doing very little physical activity.  You have difficulty eating, drinking, or talking due to asthma symptoms.  You develop chest pain.  You develop a fast heartbeat.  You have a bluish color to your lips or fingernails.  You are lightheaded, dizzy, or faint.  Your peak flow is less than 50% of your personal best.  You have a fever or persistent symptoms for more than 2 3 days.  You have a fever and symptoms suddenly get worse. MAKE SURE YOU:   Understand these  instructions.  Will watch your condition.  Will get help right away if you are not doing well or get worse. Document Released: 12/06/2005 Document Revised: 08/08/2013 Document Reviewed: 07/05/2013 [employer] Patient Information 2014 [employer].  

## 2013-11-08 NOTE — Addendum Note (Signed)
Addended by: Alison Murray on: 11/08/2013 11:39 AM   Modules accepted: Orders, Medications

## 2014-02-08 ENCOUNTER — Ambulatory Visit: Payer: Medicaid Other | Admitting: Internal Medicine

## 2014-04-24 ENCOUNTER — Emergency Department (HOSPITAL_COMMUNITY): Payer: Medicaid Other

## 2014-04-24 ENCOUNTER — Emergency Department (HOSPITAL_COMMUNITY)
Admission: EM | Admit: 2014-04-24 | Discharge: 2014-04-24 | Disposition: A | Discharge status: Home / Self Care | Payer: Medicaid Other | Attending: Emergency Medicine | Admitting: Emergency Medicine

## 2014-04-24 ENCOUNTER — Encounter (HOSPITAL_COMMUNITY): Payer: Self-pay | Admitting: Emergency Medicine

## 2014-04-24 DIAGNOSIS — G43909 Migraine, unspecified, not intractable, without status migrainosus: Secondary | ICD-10-CM | POA: Insufficient documentation

## 2014-04-24 DIAGNOSIS — Z5987 Material hardship due to limited financial resources, not elsewhere classified: Secondary | ICD-10-CM | POA: Insufficient documentation

## 2014-04-24 DIAGNOSIS — R011 Cardiac murmur, unspecified: Secondary | ICD-10-CM | POA: Insufficient documentation

## 2014-04-24 DIAGNOSIS — I10 Essential (primary) hypertension: Secondary | ICD-10-CM | POA: Insufficient documentation

## 2014-04-24 DIAGNOSIS — I252 Old myocardial infarction: Secondary | ICD-10-CM | POA: Insufficient documentation

## 2014-04-24 DIAGNOSIS — Z880 Allergy status to penicillin: Secondary | ICD-10-CM | POA: Insufficient documentation

## 2014-04-24 DIAGNOSIS — J45901 Unspecified asthma with (acute) exacerbation: Secondary | ICD-10-CM | POA: Insufficient documentation

## 2014-04-24 DIAGNOSIS — Z9851 Tubal ligation status: Secondary | ICD-10-CM | POA: Insufficient documentation

## 2014-04-24 DIAGNOSIS — Z79899 Other long term (current) drug therapy: Secondary | ICD-10-CM | POA: Insufficient documentation

## 2014-04-24 DIAGNOSIS — Z9889 Other specified postprocedural states: Secondary | ICD-10-CM | POA: Insufficient documentation

## 2014-04-24 DIAGNOSIS — Z87891 Personal history of nicotine dependence: Secondary | ICD-10-CM | POA: Insufficient documentation

## 2014-04-24 DIAGNOSIS — Z598 Other problems related to housing and economic circumstances: Secondary | ICD-10-CM | POA: Insufficient documentation

## 2014-04-24 DIAGNOSIS — IMO0002 Reserved for concepts with insufficient information to code with codable children: Secondary | ICD-10-CM | POA: Insufficient documentation

## 2014-04-24 MED ORDER — PREDNISONE 20 MG PO TABS
40.0000 mg | ORAL_TABLET | Freq: Once | ORAL | Status: AC
  Administered 2014-04-24: 40 mg via ORAL
  Filled 2014-04-24: qty 2

## 2014-04-24 MED ORDER — PREDNISONE 20 MG PO TABS: 40.0000 mg | ORAL_TABLET | Freq: Every day | ORAL | Status: DC

## 2014-04-24 MED ORDER — ALBUTEROL SULFATE (2.5 MG/3ML) 0.083% IN NEBU
5.0000 mg | INHALATION_SOLUTION | Freq: Once | RESPIRATORY_TRACT | Status: AC
  Administered 2014-04-24: 5 mg via RESPIRATORY_TRACT

## 2014-04-24 MED ORDER — ALBUTEROL SULFATE (2.5 MG/3ML) 0.083% IN NEBU
5.0000 mg | INHALATION_SOLUTION | Freq: Once | RESPIRATORY_TRACT | Status: AC
  Administered 2014-04-24: 5 mg via RESPIRATORY_TRACT
  Filled 2014-04-24: qty 6

## 2014-04-24 MED ORDER — ALBUTEROL SULFATE (2.5 MG/3ML) 0.083% IN NEBU
INHALATION_SOLUTION | RESPIRATORY_TRACT | Status: AC
  Filled 2014-04-24: qty 6

## 2014-04-24 MED ORDER — IPRATROPIUM BROMIDE 0.02 % IN SOLN
0.5000 mg | Freq: Once | RESPIRATORY_TRACT | Status: AC
  Administered 2014-04-24: 0.5 mg via RESPIRATORY_TRACT
  Filled 2014-04-24: qty 2.5

## 2014-04-24 NOTE — ED Notes (Signed)
Pt sts SOB and wheezing since last night. sts she use her rescue inhaler without relief 3 times.

## 2014-04-24 NOTE — Discharge Instructions (Signed)
Take prednisone as directed until gone. Refer to attached documents for more information. Return to the ED with worsening or concerning symptoms.  °

## 2014-04-24 NOTE — ED Provider Notes (Signed)
CSN: [id number]     Arrival date & time 04/24/14  1111 History   First MD Initiated Contact with Patient 04/24/14 1221     Chief Complaint  Patient presents with  . Asthma     (Consider location/radiation/quality/duration/timing/severity/associated sxs/prior Treatment) HPI Comments: Patient is a 48 year old female with a past medical history significant for hypertension and asthma presents to the emergency department with 6 day history of worsening SOB, cough and wheezing. Pt states that her symptoms increased last night and states that her symptoms today feel different than her typical asthma flare ups. Patient states that she took her QVAR inhaler 4 times yesterday and 3 times today. In addition she has used her rescue inhaler about 12 times today alone. Symptoms include fever, pleuritic chest pain, pale thick sputum without hemoptysis. Pt denies chest pain, rhinorrhea, recent illness, recent surgery, and hospitalization within the past 3 months.     Past Medical History  Diagnosis Date  . Asthma   . Hypertension   . Murmur, cardiac   . Financial difficulties   . MI (myocardial infarction)   . Migraine headache    Past Surgical History  Procedure Laterality Date  . Appendectomy    . Cardiac catheterization    . Tubal ligation      History reviewed. No pertinent family history. History  Substance Use Topics  . Smoking status: Former Games developermoker  . Smokeless tobacco: Not on file  . Alcohol Use: Yes     Comment: occ   OB History   Grav Para Term Preterm Abortions TAB SAB Ect Mult Living                 Review of Systems  Constitutional: Negative for fever, chills and fatigue.  HENT: Negative for trouble swallowing.   Eyes: Negative for visual disturbance.  Respiratory: Positive for shortness of breath and wheezing.   Cardiovascular: Negative for chest pain and palpitations.  Gastrointestinal: Negative for nausea, vomiting, abdominal pain and diarrhea.  Genitourinary:  Negative for dysuria and difficulty urinating.  Musculoskeletal: Negative for arthralgias and neck pain.  Skin: Negative for color change.  Neurological: Negative for dizziness and weakness.  Psychiatric/Behavioral: Negative for dysphoric mood.      Allergies  Benadryl; Other; and Penicillins  Home Medications   Prior to Admission medications   Medication Sig Start Date End Date Taking? Authorizing Provider  albuterol (PROVENTIL HFA;VENTOLIN HFA) 108 (90 BASE) MCG/ACT inhaler Inhale 2 puffs into the lungs every 4 (four) hours.   Yes Historical Provider, MD  amLODipine (NORVASC) 10 MG tablet Take 1 tablet (10 mg total) by mouth daily. 11/08/13  Yes Alison MurrayAlma M Devine, MD  beclomethasone (QVAR) 80 MCG/ACT inhaler Inhale 2 puffs into the lungs 2 (two) times daily. 11/08/13  Yes Alison MurrayAlma M Devine, MD  Multiple Vitamins-Minerals (MULTI ADULT GUMMIES PO) Take 1 tablet by mouth 2 (two) times a week.   Yes Historical Provider, MD  traMADol (ULTRAM) 50 MG tablet Take 50 mg by mouth every 6 (six) hours as needed for moderate pain. 11/08/13  Yes Alison MurrayAlma M Devine, MD   BP 144/97  Pulse 94  Temp(Src) 99 F (37.2 C) (Oral)  Resp 24  SpO2 97%  LMP 03/23/2014 Physical Exam  Nursing note and vitals reviewed. Constitutional: She is oriented to person, place, and time. She appears well-developed and well-nourished. No distress.  Patient is coughing throughout exam and interview.   HENT:  Head: Normocephalic and atraumatic.  Eyes: Conjunctivae and EOM are  normal.  Neck: Normal range of motion.  Cardiovascular: Normal rate and regular rhythm.  Exam reveals no gallop and no friction rub.   No murmur heard. Pulmonary/Chest: Effort normal. She has wheezes. She has no rales. She exhibits no tenderness.  Expiratory wheezes noted in all lung fields.   Abdominal: Soft. She exhibits no distension. There is no tenderness. There is no rebound and no guarding.  Musculoskeletal: Normal range of motion.  Neurological:  She is alert and oriented to person, place, and time. Coordination normal.  Speech is goal-oriented. Moves limbs without ataxia.   Skin: Skin is warm and dry.  Psychiatric: She has a normal mood and affect. Her behavior is normal.    ED Course  Procedures (including critical care time) Labs Review Labs Reviewed - No data to display  Imaging Review Dg Chest 2 View  04/24/2014   CLINICAL DATA:  Asthma.  EXAM: CHEST  2 VIEW  COMPARISON:  PA and lateral chest 10/24/2013.  FINDINGS: The lungs are clear. Heart size is normal. There is no pneumothorax or pleural effusion.  IMPRESSION: Negative chest.   Electronically Signed   By: Drusilla Kannerhomas  Dalessio M.D.   On: 04/24/2014 15:41     EKG Interpretation None      MDM   Final diagnoses:  Asthma exacerbation    2:53 PM Patient given albuterol nebulizer. Patient will have chest xray and additional nebulizer treatment with atrovent. Vitals stable and patient afebrile.   4:02 PM Xray unremarkable. Patient is no longer wheezing. Patient will be discharged with prednisone. Vitals stable and patient afebrile. Patient advised to return with worsening or concerning symptoms.   Emilia BeckKaitlyn Jameya Pontiff, PA-C 04/24/14 [phone]

## 2014-04-24 NOTE — ED Notes (Signed)
Patient transported to X-ray 

## 2014-04-26 NOTE — ED Provider Notes (Signed)
Medical screening examination/treatment/procedure(s) were performed by non-physician practitioner and as supervising physician I was immediately available for consultation/collaboration.   EKG Interpretation None        Phebe Dettmer M Cherrish Vitali, DO 04/26/14 2109 

## 2014-06-13 ENCOUNTER — Ambulatory Visit: Payer: Medicaid Other | Admitting: Internal Medicine

## 2015-07-15 ENCOUNTER — Emergency Department (HOSPITAL_COMMUNITY)
Admission: EM | Admit: 2015-07-15 | Discharge: 2015-07-15 | Disposition: A | Discharge status: Home / Self Care | Payer: Medicaid Other | Attending: Emergency Medicine | Admitting: Emergency Medicine

## 2015-07-15 ENCOUNTER — Encounter (HOSPITAL_COMMUNITY): Payer: Self-pay | Admitting: *Deleted

## 2015-07-15 ENCOUNTER — Emergency Department (HOSPITAL_COMMUNITY): Payer: Medicaid Other

## 2015-07-15 DIAGNOSIS — I252 Old myocardial infarction: Secondary | ICD-10-CM | POA: Insufficient documentation

## 2015-07-15 DIAGNOSIS — J45909 Unspecified asthma, uncomplicated: Secondary | ICD-10-CM | POA: Insufficient documentation

## 2015-07-15 DIAGNOSIS — I10 Essential (primary) hypertension: Secondary | ICD-10-CM | POA: Insufficient documentation

## 2015-07-15 DIAGNOSIS — Z880 Allergy status to penicillin: Secondary | ICD-10-CM | POA: Insufficient documentation

## 2015-07-15 DIAGNOSIS — L03032 Cellulitis of left toe: Secondary | ICD-10-CM | POA: Diagnosis not present

## 2015-07-15 DIAGNOSIS — G43909 Migraine, unspecified, not intractable, without status migrainosus: Secondary | ICD-10-CM | POA: Diagnosis not present

## 2015-07-15 DIAGNOSIS — Z598 Other problems related to housing and economic circumstances: Secondary | ICD-10-CM | POA: Insufficient documentation

## 2015-07-15 DIAGNOSIS — R011 Cardiac murmur, unspecified: Secondary | ICD-10-CM | POA: Diagnosis not present

## 2015-07-15 DIAGNOSIS — Z79899 Other long term (current) drug therapy: Secondary | ICD-10-CM | POA: Insufficient documentation

## 2015-07-15 DIAGNOSIS — M79675 Pain in left toe(s): Secondary | ICD-10-CM | POA: Diagnosis present

## 2015-07-15 DIAGNOSIS — T1490XA Injury, unspecified, initial encounter: Secondary | ICD-10-CM

## 2015-07-15 DIAGNOSIS — Z7951 Long term (current) use of inhaled steroids: Secondary | ICD-10-CM | POA: Insufficient documentation

## 2015-07-15 DIAGNOSIS — Z9889 Other specified postprocedural states: Secondary | ICD-10-CM | POA: Insufficient documentation

## 2015-07-15 DIAGNOSIS — Z87891 Personal history of nicotine dependence: Secondary | ICD-10-CM | POA: Insufficient documentation

## 2015-07-15 LAB — CBG MONITORING, ED: Glucose-Capillary: 92 mg/dL (ref 65–99)

## 2015-07-15 MED ORDER — HYDROCODONE-ACETAMINOPHEN 5-325 MG PO TABS
2.0000 | ORAL_TABLET | Freq: Once | ORAL | Status: AC
  Administered 2015-07-15: 2 via ORAL
  Filled 2015-07-15: qty 2

## 2015-07-15 MED ORDER — HYDROCODONE-ACETAMINOPHEN 5-325 MG PO TABS: 2.0000 | ORAL_TABLET | ORAL | Status: DC | PRN

## 2015-07-15 MED ORDER — LIDOCAINE HCL (PF) 1 % IJ SOLN
5.0000 mL | Freq: Once | INTRAMUSCULAR | Status: AC
  Administered 2015-07-15: 5 mL
  Filled 2015-07-15: qty 5

## 2015-07-15 MED ORDER — NAPROXEN SODIUM 220 MG PO TABS: 220.0000 mg | ORAL_TABLET | Freq: Two times a day (BID) | ORAL | Status: DC

## 2015-07-15 NOTE — ED Notes (Signed)
MD at bedside. 

## 2015-07-15 NOTE — ED Notes (Signed)
Pt in c/o swelling to her right great toe, states she stubbed it yesterday and is concerned something bit her tonight, redness and swelling noted

## 2015-07-15 NOTE — ED Provider Notes (Signed)
CSN: [id number]     Arrival date & time 07/15/15  0159 History   First MD Initiated Contact with Patient 07/15/15 [phone]     Chief Complaint  Patient presents with  . Toe Injury     (Consider location/radiation/quality/duration/timing/severity/associated sxs/prior Treatment) HPI 49 year old female presents emergency part with complaint of left great toe pain.  Patient reports that she had a pedicure a week ago, noted that the cuticles were cut at the close.  On Saturday, however, she stubbed her go going up the stairs.  Since that time she has had increased swelling and pain to the base of her toenail.  Patient has been doing warm soaks with Epsom salts and placing Neosporin as she was concern for infection.  The swelling has been persistent and she is having difficulties walking secondary to pain Past Medical History  Diagnosis Date  . Asthma   . Hypertension   . Murmur, cardiac   . Financial difficulties   . MI (myocardial infarction)   . Migraine headache    Past Surgical History  Procedure Laterality Date  . Appendectomy    . Cardiac catheterization    . Tubal ligation      History reviewed. No pertinent family history. History  Substance Use Topics  . Smoking status: Former Games developer  . Smokeless tobacco: Not on file  . Alcohol Use: Yes     Comment: occ   OB History    No data available     Review of Systems  See History of Present Illness; otherwise all other systems are reviewed and negative   Allergies  Benadryl; Other; and Penicillins  Home Medications   Prior to Admission medications   Medication Sig Start Date End Date Taking? Authorizing Provider  albuterol (PROVENTIL HFA;VENTOLIN HFA) 108 (90 BASE) MCG/ACT inhaler Inhale 2 puffs into the lungs every 4 (four) hours.   Yes Historical Provider, MD  amLODipine (NORVASC) 10 MG tablet Take 1 tablet (10 mg total) by mouth daily. 11/08/13  Yes Alison Murray, MD  beclomethasone (QVAR) 80 MCG/ACT inhaler Inhale 2  puffs into the lungs 2 (two) times daily. 11/08/13  Yes Alison Murray, MD  Multiple Vitamins-Minerals (MULTI ADULT GUMMIES PO) Take 1 tablet by mouth every other day.    Yes Historical Provider, MD  multivitamin (GERI-TONIC) LIQD Take 15 mLs by mouth once a week.   Yes Historical Provider, MD  HYDROcodone-acetaminophen (NORCO/VICODIN) 5-325 MG per tablet Take 2 tablets by mouth every 4 (four) hours as needed. 07/15/15   Marisa Severin, MD  naproxen sodium (ANAPROX) 220 MG tablet Take 1 tablet (220 mg total) by mouth 2 (two) times daily with a meal. 07/15/15   Marisa Severin, MD   BP 158/101 mmHg  Pulse 54  Temp(Src) 98.3 F (36.8 C) (Oral)  Resp 20  SpO2 100%  LMP 07/09/2015 Physical Exam  Musculoskeletal:  Left great toe with swelling at the nail base.  There is fluctuance and tenderness.  There is no erythema  Nursing note and vitals reviewed.   ED Course  Procedures (including critical care time) Labs Review Labs Reviewed  CBG MONITORING, ED    Imaging Review Dg Toe Great Left  07/15/2015   CLINICAL DATA:  Left great toe pain and swelling after injury earlier tonight.  EXAM: LEFT GREAT TOE  COMPARISON:  None.  FINDINGS: No fracture or dislocation. The alignment and joint spaces are maintained. Mild soft tissue edema, no radiopaque foreign body.  IMPRESSION: No fracture or dislocation of  the left great toe.   Electronically Signed   By: Rubye Oaks M.D.   On: 07/15/2015 02:33     EKG Interpretation None     INCISION AND DRAINAGE Performed by: Olivia Mackie Consent: Verbal consent obtained. Risks and benefits: risks, benefits and alternatives were discussed Type: abscess  Body area: Left great toe.  A second nail  Anesthesia: local infiltration, as well as nerve block  Incision was made with a scalpel.  Local anesthetic: lidocaine 2%   Anesthetic total: 5 ml  Complexity: complex Blunt dissection to break up loculations  Drainage: Clear serous   Drainage amount:  Scant   Packing material: None   Patient tolerance: Patient tolerated the procedure well with no immediate complications.    MDM   Final diagnoses:  Paronychia of great toe of left foot   49 year old female with what appears to be a paronychia of the left great toe.  Upon IND, patient with minimal fluid that was clear, suspect trauma causing a hematoma that is now breaking down into serous fluid.  Patient much improved after drainage of the swelling.  Patient instructed to do warm soaks twice a day.  She is to monitor for signs of infection.  Marisa Severin, MD 07/15/15 [phone]

## 2015-07-15 NOTE — Discharge Instructions (Signed)
Keep foot elevated when possible.  Soak in warm water and Epsom salts twice a day.  Monitor for signs of infection, redness, streaking of redness up the foot, drainage of pus.  It appears on your exam today that the swelling at the nail base was due to trauma.  Symptoms should be improving over the next 2-3 days.

## 2015-07-15 NOTE — ED Notes (Signed)
CBG 92. Nurse notified.  

## 2015-07-25 ENCOUNTER — Other Ambulatory Visit: Payer: Self-pay

## 2015-07-25 ENCOUNTER — Ambulatory Visit (HOSPITAL_COMMUNITY)
Admission: RE | Admit: 2015-07-25 | Discharge: 2015-07-25 | Disposition: A | Discharge status: Home / Self Care | Payer: No Typology Code available for payment source | Source: Ambulatory Visit | Attending: Cardiology | Admitting: Cardiology

## 2015-07-25 ENCOUNTER — Encounter: Payer: Self-pay | Admitting: Internal Medicine

## 2015-07-25 ENCOUNTER — Ambulatory Visit: Payer: Medicaid Other | Attending: Internal Medicine | Admitting: Internal Medicine

## 2015-07-25 VITALS — BP 155/86 | HR 52 | Temp 98.0°F | Resp 16 | Ht 62.0 in | Wt 185.0 lb

## 2015-07-25 DIAGNOSIS — R296 Repeated falls: Secondary | ICD-10-CM

## 2015-07-25 DIAGNOSIS — L03032 Cellulitis of left toe: Secondary | ICD-10-CM | POA: Diagnosis not present

## 2015-07-25 DIAGNOSIS — I252 Old myocardial infarction: Secondary | ICD-10-CM | POA: Insufficient documentation

## 2015-07-25 DIAGNOSIS — J45909 Unspecified asthma, uncomplicated: Secondary | ICD-10-CM | POA: Insufficient documentation

## 2015-07-25 DIAGNOSIS — I10 Essential (primary) hypertension: Secondary | ICD-10-CM | POA: Diagnosis not present

## 2015-07-25 DIAGNOSIS — IMO0002 Reserved for concepts with insufficient information to code with codable children: Secondary | ICD-10-CM

## 2015-07-25 DIAGNOSIS — Z9181 History of falling: Secondary | ICD-10-CM | POA: Diagnosis not present

## 2015-07-25 DIAGNOSIS — J4520 Mild intermittent asthma, uncomplicated: Secondary | ICD-10-CM

## 2015-07-25 DIAGNOSIS — I951 Orthostatic hypotension: Secondary | ICD-10-CM | POA: Diagnosis not present

## 2015-07-25 DIAGNOSIS — Z79899 Other long term (current) drug therapy: Secondary | ICD-10-CM | POA: Insufficient documentation

## 2015-07-25 DIAGNOSIS — R42 Dizziness and giddiness: Secondary | ICD-10-CM | POA: Insufficient documentation

## 2015-07-25 DIAGNOSIS — R001 Bradycardia, unspecified: Secondary | ICD-10-CM | POA: Diagnosis not present

## 2015-07-25 DIAGNOSIS — L03019 Cellulitis of unspecified finger: Secondary | ICD-10-CM

## 2015-07-25 DIAGNOSIS — Z87891 Personal history of nicotine dependence: Secondary | ICD-10-CM | POA: Insufficient documentation

## 2015-07-25 MED ORDER — ALBUTEROL SULFATE HFA 108 (90 BASE) MCG/ACT IN AERS: 2.0000 | INHALATION_SPRAY | RESPIRATORY_TRACT | Status: DC

## 2015-07-25 MED ORDER — TRAMADOL HCL 50 MG PO TABS: 50.0000 mg | ORAL_TABLET | Freq: Three times a day (TID) | ORAL | Status: DC | PRN

## 2015-07-25 MED ORDER — LOSARTAN POTASSIUM 50 MG PO TABS: 50.0000 mg | ORAL_TABLET | Freq: Every day | ORAL | Status: DC

## 2015-07-25 MED ORDER — BECLOMETHASONE DIPROPIONATE 80 MCG/ACT IN AERS: 2.0000 | INHALATION_SPRAY | Freq: Two times a day (BID) | RESPIRATORY_TRACT | Status: DC

## 2015-07-25 MED ORDER — SULFAMETHOXAZOLE-TRIMETHOPRIM 800-160 MG PO TABS: 1.0000 | ORAL_TABLET | Freq: Two times a day (BID) | ORAL | Status: DC

## 2015-07-25 MED ORDER — AMLODIPINE BESYLATE 10 MG PO TABS: 10.0000 mg | ORAL_TABLET | Freq: Every day | ORAL | Status: DC

## 2015-07-25 NOTE — Progress Notes (Signed)
Pt here for f/up ED for great left toe Paronychia. Re establish care for Rx refill.  Pt states she jammed her big toe breaking her nail off. Pain level 5. Pt describe pain as shooting pain.  Pain radiates from foot to calf.  Pt states she had fallen 4-5x last  6wks.  Rx refill albuterol Qvar Amlodipine

## 2015-07-25 NOTE — Patient Instructions (Addendum)

## 2015-07-25 NOTE — Progress Notes (Signed)
Patient ID: Marilyn Owen, female   DOB: Apr 21, 1966, 49 y.o.   MRN: [id number]  CC: toe pain, dizziness  HPI: Marilyn Owen is a 49 y.o. female here today for a follow up visit.  Patient has past medical history of asthma. HTN, murmur, MI (2005),TIA (2004), and migraines. Patient was seen in ER on 7/26 for paronychia. Patient reports that she stumped her left great toe a couple days before going to the ER which caused her toenail to push back into her skin. While in the ER it was drained due to the swelling. She now reports that she stumped the same toe again 2 days ago which caused her toenail to come off. She reports increased pain since then with some yellow drainage around cuticle.   Patient is also concerned about increased dizziness over the past 2 months. She states that she has fallen twice and have multiple near falls over the past 6 weeks. She reports sensation of feeling off balance while sitting, standing, or walking. Dizzy episodes last 1-2 minutes and she sometimes feels SOB or nauseous. She denies a history of anemia and LMP was July 15th. She denies tobacco use, drug use, or alcohol use.    Allergies  Allergen Reactions  . Benadryl [Diphenhydramine Hcl] Anaphylaxis  . Other Anaphylaxis and Swelling    Bananas and Almonds.   . Penicillins Anaphylaxis   Past Medical History  Diagnosis Date  . Asthma   . Hypertension   . Murmur, cardiac   . Financial difficulties   . MI (myocardial infarction)   . Migraine headache    Current Outpatient Prescriptions on File Prior to Visit  Medication Sig Dispense Refill  . albuterol (PROVENTIL HFA;VENTOLIN HFA) 108 (90 BASE) MCG/ACT inhaler Inhale 2 puffs into the lungs every 4 (four) hours.    [name] amLODipine (NORVASC) 10 MG tablet Take 1 tablet (10 mg total) by mouth daily. 30 tablet 5  . beclomethasone (QVAR) 80 MCG/ACT inhaler Inhale 2 puffs into the lungs 2 (two) times daily. 1 Inhaler 12  . Multiple Vitamins-Minerals (MULTI ADULT GUMMIES  PO) Take 1 tablet by mouth every other day.     . multivitamin (GERI-TONIC) LIQD Take 15 mLs by mouth once a week.    . naproxen sodium (ANAPROX) 220 MG tablet Take 1 tablet (220 mg total) by mouth 2 (two) times daily with a meal. 30 tablet 0  . HYDROcodone-acetaminophen (NORCO/VICODIN) 5-325 MG per tablet Take 2 tablets by mouth every 4 (four) hours as needed. (Patient not taking: Reported on 07/25/2015) 12 tablet 0   No current facility-administered medications on file prior to visit.   Family History  Problem Relation Age of Onset  . Hypertension Mother   . Heart attack Mother   . Heart attack Father    History   Social History  . Marital Status: Single    Spouse Name: N/A  . Number of Children: 4  . Years of Education: N/A   Occupational History  . Not on file.   Social History Main Topics  . Smoking status: Former Games developer  . Smokeless tobacco: Not on file  . Alcohol Use: Yes     Comment: occ  . Drug Use: Yes    Special: Marijuana  . Sexual Activity: Not on file   Other Topics Concern  . Not on file   Social History Narrative    Review of Systems  Constitutional: Negative for weight loss.  Eyes: Negative for blurred vision.  Respiratory: Positive for shortness  of breath. Negative for cough and wheezing.   Cardiovascular: Positive for palpitations. Negative for chest pain and leg swelling.  Gastrointestinal: Positive for nausea.  Musculoskeletal: Positive for falls.  Neurological: Positive for dizziness. Negative for weakness and headaches.  Psychiatric/Behavioral: Negative for depression and substance abuse. The patient is not nervous/anxious.   All other systems reviewed and are negative.   Objective:   Filed Vitals:   07/25/15 1408  BP: 157/88  Pulse: 67  Temp: 98.7 F (37.1 C)  Resp: 18    Physical Exam  Constitutional: She is oriented to person, place, and time. No distress.  Neck: No thyromegaly present.  Cardiovascular: Regular rhythm.   Murmur  heard. Bradycardia  Pulmonary/Chest: Effort normal and breath sounds normal.  Musculoskeletal: She exhibits no edema.  Neurological: She is alert and oriented to person, place, and time.  Skin: Skin is warm and dry. She is not diaphoretic.  Soft boggy skin around cuticle of left great toe, scant drainage noted  Psychiatric: She has a normal mood and affect.     Lab Results  Component Value Date   WBC 5.6 10/24/2013   HGB 14.7 10/24/2013   HCT 42.9 10/24/2013   MCV 92.7 10/24/2013   PLT 233 10/24/2013   Lab Results  Component Value Date   CREATININE 0.83 10/24/2013   BUN 12 10/24/2013   NA 139 10/24/2013   K 3.8 10/24/2013   CL 105 10/24/2013   CO2 23 10/24/2013    Lab Results  Component Value Date   HGBA1C 5.0 11/08/2013   Lipid Panel     Component Value Date/Time   CHOL 183 11/08/2013 1115   TRIG 51 11/08/2013 1115   HDL 63 11/08/2013 1115   CHOLHDL 2.9 11/08/2013 1115   VLDL 10 11/08/2013 1115   LDLCALC 110* 11/08/2013 1115       Assessment and plan:   Marilyn Owen was seen today for hospitalization follow-up.  Diagnoses and all orders for this visit:  Dizziness Orders: -     Orthostatic vital signs; Standing -     EKG; Standing -     Orthostatic vital signs -     EKG -     CBC; Future -     COMPLETE METABOLIC PANEL WITH GFR; Future -     Vitamin D, 25-hydroxy; Future -     TSH; Future Orthostatic changes noted in systolic pressure upon position change. I cannot r/o cardiac etiology at this point I will have patient to come back Monday for all labs.  Multiple falls Likely due to dizziness as a result of Orthostatic hypotension.   Bradycardia Unable to differentiate if patient is having symptomatic bradycardia or if she is having symptoms relating to orthostatic changes. I will refer patient to Dr. Daleen Squibb for consult due to history of murmur with no Echo on file and severity of symptoms.  Orthostatic hypotension I have advised patient to stay hydrated  and increase sodium intake to help with symptoms.  Essential hypertension Orders: -     Lipid panel; Future -     Refill amLODipine (NORVASC) 10 MG tablet; Take 1 tablet (10 mg total) by mouth daily. -    Begin losartan (COZAAR) 50 MG tablet; Take 1 tablet (50 mg total) by mouth daily. Patients BP is uncontrolled on amlodipine monotherapy. I will add losartan to gain better control of pressure. I have advised her to take pills a couple hours apart to avoid dizziness due to medication. Advised slow position changes  when standing.  Asthma, mild intermittent, uncomplicated Orders: -     albuterol (PROVENTIL HFA;VENTOLIN HFA) 108 (90 BASE) MCG/ACT inhaler; Inhale 2 puffs into the lungs every 4 (four) hours. -     beclomethasone (QVAR) 80 MCG/ACT inhaler; Inhale 2 puffs into the lungs 2 (two) times daily. Stable, refilled medications  Paronychia, unspecified laterality Orders: -     sulfamethoxazole-trimethoprim (BACTRIM DS,SEPTRA DS) 800-160 MG per tablet; Take 1 tablet by mouth 2 (two) times daily. -     traMADol (ULTRAM) 50 MG tablet; Take 1 tablet (50 mg total) by mouth every 8 (eight) hours as needed. Will place patient on antibiotic therapy to clear infection.   Return in about 1 week (around 08/01/2015) for Lab Visit and Wednesday Dr. Daleen Squibb .       Ambrose Finland, NP-C [facility] [phone] 07/25/2015, 2:38 PM

## 2015-07-26 DIAGNOSIS — R42 Dizziness and giddiness: Secondary | ICD-10-CM | POA: Insufficient documentation

## 2015-07-26 DIAGNOSIS — R296 Repeated falls: Secondary | ICD-10-CM | POA: Insufficient documentation

## 2015-07-26 DIAGNOSIS — I951 Orthostatic hypotension: Secondary | ICD-10-CM | POA: Insufficient documentation

## 2015-07-26 DIAGNOSIS — R001 Bradycardia, unspecified: Secondary | ICD-10-CM | POA: Insufficient documentation

## 2015-07-30 ENCOUNTER — Ambulatory Visit: Payer: Medicaid Other | Attending: Internal Medicine

## 2015-07-30 DIAGNOSIS — R42 Dizziness and giddiness: Secondary | ICD-10-CM

## 2015-07-30 DIAGNOSIS — I10 Essential (primary) hypertension: Secondary | ICD-10-CM

## 2015-07-30 LAB — CBC
HEMATOCRIT: 40.8 % (ref 36.0–46.0)
HEMOGLOBIN: 13.7 g/dL (ref 12.0–15.0)
MCH: 31.1 pg (ref 26.0–34.0)
MCHC: 33.6 g/dL (ref 30.0–36.0)
MCV: 92.5 fL (ref 78.0–100.0)
MPV: 10.7 fL (ref 8.6–12.4)
Platelets: 245 10*3/uL (ref 150–400)
RBC: 4.41 MIL/uL (ref 3.87–5.11)
RDW: 13.8 % (ref 11.5–15.5)
WBC: 6.6 10*3/uL (ref 4.0–10.5)

## 2015-07-30 LAB — COMPLETE METABOLIC PANEL WITH GFR
ALBUMIN: 4.3 g/dL (ref 3.6–5.1)
ALT: 10 U/L (ref 6–29)
AST: 16 U/L (ref 10–35)
Alkaline Phosphatase: 55 U/L (ref 33–115)
BUN: 10 mg/dL (ref 7–25)
CO2: 23 mmol/L (ref 20–31)
Calcium: 9.2 mg/dL (ref 8.6–10.2)
Chloride: 107 mmol/L (ref 98–110)
Creat: 1.05 mg/dL (ref 0.50–1.10)
GFR, Est African American: 74 mL/min (ref >=60)
GFR, Est Non African American: 64 mL/min (ref >=60)
GLUCOSE: 92 mg/dL (ref 65–99)
POTASSIUM: 4.8 mmol/L (ref 3.5–5.3)
SODIUM: 139 mmol/L (ref 135–146)
Total Bilirubin: 0.5 mg/dL (ref 0.2–1.2)
Total Protein: 7.3 g/dL (ref 6.1–8.1)

## 2015-07-30 LAB — LIPID PANEL
CHOL/HDL RATIO: 2.5 ratio (ref <=5.0)
CHOLESTEROL: 175 mg/dL (ref 125–200)
HDL: 70 mg/dL (ref >=46)
LDL CALC: 96 mg/dL (ref <130)
TRIGLYCERIDES: 43 mg/dL (ref <150)
VLDL: 9 mg/dL (ref <30)

## 2015-07-30 LAB — TSH: TSH: 0.516 u[IU]/mL (ref 0.350–4.500)

## 2015-07-31 LAB — VITAMIN D 25 HYDROXY (VIT D DEFICIENCY, FRACTURES): Vit D, 25-Hydroxy: 11 ng/mL — ABNORMAL LOW (ref 30–100)

## 2015-08-01 ENCOUNTER — Telehealth: Payer: Self-pay

## 2015-08-01 MED ORDER — VITAMIN D (ERGOCALCIFEROL) 1.25 MG (50000 UNIT) PO CAPS: 50000.0000 [IU] | ORAL_CAPSULE | ORAL | Status: DC

## 2015-08-01 NOTE — Telephone Encounter (Signed)
Returned patient phone call Patient is aware of her lab results and to pick up her vit D At the pharmacy here Patient also requesting a refill on her norvasc Prescription sent to the pharmacy as well and patient is aware

## 2015-08-01 NOTE — Telephone Encounter (Signed)
-----   Message from Ambrose Finland, NP sent at 07/31/2015  8:26 AM EDT ----- Vitamin D is low. Please send drisdol 50,000 IU to take once weekly for 12 weeks. 12 tablets no refills. After completion of 12 pills she can get OTC multi-vitamin to take daily or a OTC vitamin 800 IU to take. Cholesterol has improved slightly.

## 2015-08-01 NOTE — Telephone Encounter (Signed)
Patient returned phone call, please f/u °

## 2015-08-01 NOTE — Telephone Encounter (Signed)
Patient not available at this time Message left on voice mail to return our call Prescription for vitamin D sent to the pharmacy on file

## 2015-08-20 ENCOUNTER — Encounter: Payer: Self-pay | Admitting: Cardiology

## 2015-08-20 ENCOUNTER — Ambulatory Visit: Payer: Medicaid Other | Attending: Cardiology | Admitting: Cardiology

## 2015-08-20 VITALS — BP 160/105 | HR 69 | Temp 98.5°F | Resp 18 | Ht 62.0 in | Wt 178.2 lb

## 2015-08-20 DIAGNOSIS — I252 Old myocardial infarction: Secondary | ICD-10-CM | POA: Insufficient documentation

## 2015-08-20 DIAGNOSIS — R55 Syncope and collapse: Secondary | ICD-10-CM | POA: Insufficient documentation

## 2015-08-20 DIAGNOSIS — Z87891 Personal history of nicotine dependence: Secondary | ICD-10-CM | POA: Insufficient documentation

## 2015-08-20 DIAGNOSIS — R001 Bradycardia, unspecified: Secondary | ICD-10-CM

## 2015-08-20 DIAGNOSIS — Z79899 Other long term (current) drug therapy: Secondary | ICD-10-CM | POA: Insufficient documentation

## 2015-08-20 DIAGNOSIS — I10 Essential (primary) hypertension: Secondary | ICD-10-CM | POA: Insufficient documentation

## 2015-08-20 DIAGNOSIS — J45909 Unspecified asthma, uncomplicated: Secondary | ICD-10-CM | POA: Insufficient documentation

## 2015-08-20 NOTE — Assessment & Plan Note (Signed)
She has had sinus bradycardia on resting EKG. She has normal intervals. The spells sound like her heart slows down subjectively. Other symptoms appear to be more vasovagal or even anxiety with hyperventilation.  We'll obtain a 2-D echocardiogram to rule out mitral valve prolapse or other valvular disease. We'll also obtain a 30 day event recorder to correlate her symptoms with her heart rhythm and rate. I will see her after the event recorder.

## 2015-08-20 NOTE — Progress Notes (Signed)
HPI Marilyn Owen is a 49 year old Afro-American female referred by Erick Blinks for the evaluation of presyncope.  She's had multiple spells of this. It can occur at rest or with activity. There is no premonition. She becomes lightheaded, sees white spots, feels like her heart slows down, get short of breath, but has not passed out. She is fallen about 8 times but no injury.  There is no history of seizure disorder. She had a cardiac catheterization done in 2003 with normal coronaries. She has never been told she has any valvular heart disease including mitral valve prolapse. She has a history of childhood and now dull asthma and uses inhalers. Sometimes this occurs after she uses her albuterol.  She denies any symptoms of orthostatic hypotension. She drinks plain water and her urine stays relatively clear.  She denies any palpitations. She does note that her fingers get numb about 5 minutes into this event.  She denies any melanoma or medication. She's had no diarrhea. Her weight has been relatively stable.  Her laboratory data is unremarkable including a TSH. EKG shows sinus bradycardia with normal intervals. Her last chest x-ray was couple years ago showed a normal-sized heart. There was no evidence of pulmonary hypertension.  She does not smoke except occasional marijuana. She rarely has a glass of wine. She denies any illicit drugs.  Past Medical History  Diagnosis Date  . Asthma   . Hypertension   . Murmur, cardiac   . Financial difficulties   . MI (myocardial infarction)   . Migraine headache     Current Outpatient Prescriptions  Medication Sig Dispense Refill  . albuterol (PROVENTIL HFA;VENTOLIN HFA) 108 (90 BASE) MCG/ACT inhaler Inhale 2 puffs into the lungs every 4 (four) hours. 1 Inhaler 3  . amLODipine (NORVASC) 10 MG tablet Take 1 tablet (10 mg total) by mouth daily. 30 tablet 5  . beclomethasone (QVAR) 80 MCG/ACT inhaler Inhale 2 puffs into the lungs 2 (two) times daily. 1  Inhaler 12  . losartan (COZAAR) 50 MG tablet Take 1 tablet (50 mg total) by mouth daily. 90 tablet 3  . Multiple Vitamins-Minerals (MULTI ADULT GUMMIES PO) Take 1 tablet by mouth every other day.     . multivitamin (GERI-TONIC) LIQD Take 15 mLs by mouth once a week.    . naproxen sodium (ANAPROX) 220 MG tablet Take 1 tablet (220 mg total) by mouth 2 (two) times daily with a meal. 30 tablet 0  . traMADol (ULTRAM) 50 MG tablet Take 1 tablet (50 mg total) by mouth every 8 (eight) hours as needed. 30 tablet 0  . Vitamin D, Ergocalciferol, (DRISDOL) 50000 UNITS CAPS capsule Take 1 capsule (50,000 Units total) by mouth every 7 (seven) days. 12 capsule 0  . HYDROcodone-acetaminophen (NORCO/VICODIN) 5-325 MG per tablet Take 2 tablets by mouth every 4 (four) hours as needed. (Patient not taking: Reported on 07/25/2015) 12 tablet 0  . sulfamethoxazole-trimethoprim (BACTRIM DS,SEPTRA DS) 800-160 MG per tablet Take 1 tablet by mouth 2 (two) times daily. (Patient not taking: Reported on 08/20/2015) 20 tablet 0   No current facility-administered medications for this visit.    Allergies  Allergen Reactions  . Benadryl [Diphenhydramine Hcl] Anaphylaxis  . Other Anaphylaxis and Swelling    Bananas and Almonds.   . Penicillins Anaphylaxis    Family History  Problem Relation Age of Onset  . Hypertension Mother   . Heart attack Mother   . Heart attack Father     Social History   Social  History  . Marital Status: Single    Spouse Name: N/A  . Number of Children: 4  . Years of Education: N/A   Occupational History  . Not on file.   Social History Main Topics  . Smoking status: Former Games developer  . Smokeless tobacco: Not on file  . Alcohol Use: Yes     Comment: occ  . Drug Use: Yes    Special: Marijuana     Comment: last use 08/18/15  . Sexual Activity: Not on file   Other Topics Concern  . Not on file   Social History Narrative    ROS ALL NEGATIVE EXCEPT THOSE NOTED IN HPI  PE  General  Appearance: well developed, well nourished in no acute distress HEENT: symmetrical face, PERRLA, good dentition  Neck: no JVD, thyromegaly, or adenopathy, trachea midline Chest: symmetric without deformity Cardiac: PMI non-displaced, RRR, normal S1, S2, no gallop, soft systolic murmur at the apex but no obvious click heard. Lung: clear to ausculation and percussion Vascular: all pulses full without bruits  Abdominal: nondistended, nontender, good bowel sounds, no HSM, no bruits Extremities: no cyanosis, clubbing or edema, no sign of DVT, no varicosities  Skin: normal color, no rashes Neuro: alert and oriented x 3, non-focal Pysch: normal affect  EKG  BMET    Component Value Date/Time   NA 139 07/30/2015 0908   K 4.8 07/30/2015 0908   CL 107 07/30/2015 0908   CO2 23 07/30/2015 0908   GLUCOSE 92 07/30/2015 0908   BUN 10 07/30/2015 0908   CREATININE 1.05 07/30/2015 0908   CREATININE 0.83 10/24/2013 1104   CALCIUM 9.2 07/30/2015 0908   GFRNONAA 64 07/30/2015 0908   GFRNONAA 84* 10/24/2013 1104   GFRAA 74 07/30/2015 0908   GFRAA >90 10/24/2013 1104    Lipid Panel     Component Value Date/Time   CHOL 175 07/30/2015 0908   TRIG 43 07/30/2015 0908   HDL 70 07/30/2015 0908   CHOLHDL 2.5 07/30/2015 0908   VLDL 9 07/30/2015 0908   LDLCALC 96 07/30/2015 0908    CBC    Component Value Date/Time   WBC 6.6 07/30/2015 0908   RBC 4.41 07/30/2015 0908   HGB 13.7 07/30/2015 0908   HCT 40.8 07/30/2015 0908   PLT 245 07/30/2015 0908   MCV 92.5 07/30/2015 0908   MCH 31.1 07/30/2015 0908   MCHC 33.6 07/30/2015 0908   RDW 13.8 07/30/2015 0908   LYMPHSABS 1.4 10/24/2013 1104   MONOABS 0.6 10/24/2013 1104   EOSABS 0.1 10/24/2013 1104   BASOSABS 0.1 10/24/2013 1104

## 2015-08-20 NOTE — Progress Notes (Signed)
Pt referred by [facility] for bradycardia consult due to history of heart murmur. Pt reports chest tighness and flushed feeling that occurs periodically 8-10x daily. Pt reports she feels like her heart stops. Pt states that when these episodes occur she gets really cold and once it surpasses she gets hot and sweaty. Pt has SOB that accompanies these episodes. Pt reports wheezing periodically and notices it more when she is resting. Pt reports edema in her hands and feet which has been present for the last 3 days. Pt reports dizziness that causes white lights to pass over her eyes and she gets hazy. Dizziness has been present for the last 4 months. Pt states her inhalers and BP medications contribute to her dizziness. Pt has had 8 falls within the last 3 months. Pt states her chest tightness and dizziness has contributed to her falls. Pt has taken her AM medications today. Pt reports she takes her losartan and amlodipine at the same time.

## 2015-08-21 ENCOUNTER — Ambulatory Visit (HOSPITAL_COMMUNITY)
Admission: RE | Admit: 2015-08-21 | Discharge: 2015-08-21 | Disposition: A | Discharge status: Home / Self Care | Payer: Medicaid Other | Source: Ambulatory Visit | Attending: Cardiology | Admitting: Cardiology

## 2015-08-21 DIAGNOSIS — I340 Nonrheumatic mitral (valve) insufficiency: Secondary | ICD-10-CM | POA: Insufficient documentation

## 2015-08-21 DIAGNOSIS — I10 Essential (primary) hypertension: Secondary | ICD-10-CM | POA: Insufficient documentation

## 2015-08-21 DIAGNOSIS — R55 Syncope and collapse: Secondary | ICD-10-CM

## 2015-08-21 DIAGNOSIS — I371 Nonrheumatic pulmonary valve insufficiency: Secondary | ICD-10-CM | POA: Insufficient documentation

## 2015-08-21 NOTE — Progress Notes (Signed)
Echocardiogram 2D Echocardiogram has been performed.  Dorothey Baseman 08/21/2015, 2:55 PM

## 2015-09-01 ENCOUNTER — Encounter: Payer: Self-pay | Admitting: *Deleted

## 2015-09-01 ENCOUNTER — Ambulatory Visit: Payer: Self-pay

## 2015-09-01 NOTE — Progress Notes (Signed)
Patient ID: Marilyn Owen, female   DOB: Sep 13, 1966, 49 y.o.   MRN: [id number] Patient enrolled for a [employer] 30 day cardiac event monitor pending approval for hardship program.  Patient does not have a land line phone so she will need to have her monitor scheduled and applied in our office pending hardship approval.

## 2015-09-12 ENCOUNTER — Ambulatory Visit (INDEPENDENT_AMBULATORY_CARE_PROVIDER_SITE_OTHER): Payer: Self-pay

## 2015-09-12 DIAGNOSIS — R001 Bradycardia, unspecified: Secondary | ICD-10-CM

## 2015-09-12 DIAGNOSIS — R55 Syncope and collapse: Secondary | ICD-10-CM

## 2015-09-24 ENCOUNTER — Telehealth: Payer: Self-pay | Admitting: *Deleted

## 2015-09-24 NOTE — Telephone Encounter (Signed)
Patient verified DOB. Patient made aware of Echo being normal. Murmur is benign flow murmur and no mitral valve prolapse. Patient had no further questions.

## 2015-09-24 NOTE — Telephone Encounter (Signed)
-----   Message from Tandy Gaw, RN sent at 09/22/2015  2:03 PM EDT -----   ----- Message -----    From: Gaylord Shih, MD    Sent: 08/27/2015   9:17 AM      To: Tandy Gaw, RN  Echo is normal. Murmur is benign flow murmur. No Mitral valve prolapse.

## 2015-11-10 ENCOUNTER — Telehealth: Payer: Self-pay | Admitting: *Deleted

## 2015-11-10 NOTE — Telephone Encounter (Signed)
-----   Message from Gaylord Shihhomas C Wall, MD sent at 11/05/2015  9:53 AM EST ----- Event recorder reviewed in detail. Lightheadedness and one episode of passing out associated with normal sinus rhythm, sinus bradycardia and sinus tachycardia. Her echocardiogram is also normal with no evidence of mitral valve prolapse. She is not orthostatic.  Her episodes of lightheadedness, weakness, and passing out or not cardiac related. Please reassure her that her heart is normal on echocardiogram, and that her event recorder does not support or suggest any kind of arrhythmia. No further cardiac workup indicated. Reassurance at this time. No need for cardiac follow-up.

## 2015-11-10 NOTE — Telephone Encounter (Signed)
Patient verified DOB Patient informed of Echocardiogram showing no evidence of orthostatic, mitral valve prolapse. Patient EKG showing normal sinus brady/tachy. Patient advised of lightheadedness,weakness,and syncope is not cardiac related. Patient had no further questions. Patient will call in her refills to the pharmacy.

## 2017-09-29 ENCOUNTER — Emergency Department (HOSPITAL_COMMUNITY)
Admission: EM | Admit: 2017-09-29 | Discharge: 2017-09-29 | Disposition: A | Discharge status: Home / Self Care | Payer: Self-pay | Attending: Emergency Medicine | Admitting: Emergency Medicine

## 2017-09-29 ENCOUNTER — Encounter (HOSPITAL_COMMUNITY): Payer: Self-pay | Admitting: *Deleted

## 2017-09-29 ENCOUNTER — Emergency Department (HOSPITAL_COMMUNITY): Payer: Self-pay

## 2017-09-29 DIAGNOSIS — Z87891 Personal history of nicotine dependence: Secondary | ICD-10-CM | POA: Insufficient documentation

## 2017-09-29 DIAGNOSIS — R079 Chest pain, unspecified: Secondary | ICD-10-CM | POA: Insufficient documentation

## 2017-09-29 DIAGNOSIS — Z79899 Other long term (current) drug therapy: Secondary | ICD-10-CM | POA: Insufficient documentation

## 2017-09-29 DIAGNOSIS — J45909 Unspecified asthma, uncomplicated: Secondary | ICD-10-CM | POA: Insufficient documentation

## 2017-09-29 DIAGNOSIS — G43409 Hemiplegic migraine, not intractable, without status migrainosus: Secondary | ICD-10-CM | POA: Insufficient documentation

## 2017-09-29 DIAGNOSIS — I10 Essential (primary) hypertension: Secondary | ICD-10-CM | POA: Insufficient documentation

## 2017-09-29 LAB — CBC
HEMATOCRIT: 36 % (ref 36.0–46.0)
Hemoglobin: 10.9 g/dL — ABNORMAL LOW (ref 12.0–15.0)
MCH: 25.5 pg — AB (ref 26.0–34.0)
MCHC: 30.3 g/dL (ref 30.0–36.0)
MCV: 84.3 fL (ref 78.0–100.0)
Platelets: 260 10*3/uL (ref 150–400)
RBC: 4.27 MIL/uL (ref 3.87–5.11)
RDW: 15.8 % — AB (ref 11.5–15.5)
WBC: 5.2 10*3/uL (ref 4.0–10.5)

## 2017-09-29 LAB — DIFFERENTIAL
BASOS PCT: 1 %
Basophils Absolute: 0 10*3/uL (ref 0.0–0.1)
EOS PCT: 2 %
Eosinophils Absolute: 0.1 10*3/uL (ref 0.0–0.7)
LYMPHS PCT: 43 %
Lymphs Abs: 2.2 10*3/uL (ref 0.7–4.0)
MONO ABS: 0.4 10*3/uL (ref 0.1–1.0)
MONOS PCT: 9 %
NEUTROS ABS: 2.4 10*3/uL (ref 1.7–7.7)
Neutrophils Relative %: 47 %

## 2017-09-29 LAB — COMPREHENSIVE METABOLIC PANEL
ALK PHOS: 52 U/L (ref 38–126)
ALT: 17 U/L (ref 14–54)
ANION GAP: 9 (ref 5–15)
AST: 24 U/L (ref 15–41)
Albumin: 4.2 g/dL (ref 3.5–5.0)
BUN: 9 mg/dL (ref 6–20)
CALCIUM: 9.2 mg/dL (ref 8.9–10.3)
CO2: 24 mmol/L (ref 22–32)
CREATININE: 0.8 mg/dL (ref 0.44–1.00)
Chloride: 105 mmol/L (ref 101–111)
Glucose, Bld: 79 mg/dL (ref 65–99)
Potassium: 3.5 mmol/L (ref 3.5–5.1)
Sodium: 138 mmol/L (ref 135–145)
Total Bilirubin: 0.9 mg/dL (ref 0.3–1.2)
Total Protein: 7.6 g/dL (ref 6.5–8.1)

## 2017-09-29 LAB — APTT: aPTT: 32 seconds (ref 24–36)

## 2017-09-29 LAB — I-STAT CHEM 8, ED
BUN: 11 mg/dL (ref 6–20)
CALCIUM ION: 1.16 mmol/L (ref 1.15–1.40)
Chloride: 104 mmol/L (ref 101–111)
Creatinine, Ser: 0.8 mg/dL (ref 0.44–1.00)
GLUCOSE: 77 mg/dL (ref 65–99)
HCT: 37 % (ref 36.0–46.0)
HEMOGLOBIN: 12.6 g/dL (ref 12.0–15.0)
Potassium: 3.7 mmol/L (ref 3.5–5.1)
Sodium: 141 mmol/L (ref 135–145)
TCO2: 28 mmol/L (ref 22–32)

## 2017-09-29 LAB — I-STAT TROPONIN, ED: TROPONIN I, POC: 0 ng/mL (ref 0.00–0.08)

## 2017-09-29 LAB — PROTIME-INR
INR: 1
PROTHROMBIN TIME: 13.1 s (ref 11.4–15.2)

## 2017-09-29 MED ORDER — BUTALBITAL-APAP-CAFFEINE 50-325-40 MG PO TABS: 1.0000 | ORAL_TABLET | Freq: Four times a day (QID) | ORAL | 0 refills | Status: AC | PRN

## 2017-09-29 MED ORDER — PROMETHAZINE HCL 25 MG/ML IJ SOLN
25.0000 mg | Freq: Once | INTRAMUSCULAR | Status: AC
  Administered 2017-09-29: 25 mg via INTRAVENOUS
  Filled 2017-09-29: qty 1

## 2017-09-29 MED ORDER — KETOROLAC TROMETHAMINE 15 MG/ML IJ SOLN
15.0000 mg | Freq: Once | INTRAMUSCULAR | Status: AC
  Administered 2017-09-29: 15 mg via INTRAVENOUS
  Filled 2017-09-29: qty 1

## 2017-09-29 MED ORDER — SODIUM CHLORIDE 0.9 % IV BOLUS (SEPSIS)
1000.0000 mL | Freq: Once | INTRAVENOUS | Status: AC
  Administered 2017-09-29: 1000 mL via INTRAVENOUS

## 2017-09-29 NOTE — ED Notes (Signed)
Got patient undress on the monitor patient is resting with family at bedside and call bell at reach 

## 2017-09-29 NOTE — ED Provider Notes (Signed)
[facility]-EMERGENCY DEPT Provider Note   CSN: [id number] Arrival date & time: 09/29/17  1006     History   Chief Complaint Chief Complaint  Patient presents with  . Numbness  . Migraine    HPI Marilyn Owen is a 51 y.o. female.  HPI  51 year old female presents with a chief complaint of headache. She states the headache has been on and off for about 3 days. The patient's headache is diffuse but worse on her right side. Feels sharp and stabbing. It pulsates. No fevers or neck stiffness but she has had some bilateral posterior neck pain. She has a history of headaches in the past but this is a little different. There is also associated photophobia but no blurry vision. Last night when she went to better on a p.m. she started feeling numbness over the left side of her face, mostly her cheek. Since then this morning the cheek feels better and now feels like she's getting sensation back, like when Novocain is wearing off from the dentist. However at around 8:30 AM she noticed some numbness and decreased strength in her left arm. Her left arm is diffusely numb and tingly. Some numbness goes into her proximal thigh. No weakness in her leg. This is been constant since starting at 8:30. Has tried Tylenol and ibuprofen without relief. Headache is a 7/10. This morning at 8:30 when these other symptoms started she also noticed some chest discomfort. She describes this as feeling like there is a gas bubble in her chest. Feels like she needs to burp but can't.  Past Medical History:  Diagnosis Date  . Asthma   . Financial difficulties   . Hypertension   . MI (myocardial infarction) (HCC)   . Migraine headache   . Murmur, cardiac     Patient Active Problem List   Diagnosis Date Noted  . Orthostatic hypotension 07/26/2015  . Bradycardia 07/26/2015  . Dizziness 07/26/2015  . Multiple falls 07/26/2015  . Extrinsic asthma with exacerbation 05/11/2012  . HTN (hypertension) 09/22/2010    Past  Surgical History:  Procedure Laterality Date  . APPENDECTOMY    . CARDIAC CATHETERIZATION    . tubal ligation       OB History    No data available       Home Medications    Prior to Admission medications   Medication Sig Start Date End Date Taking? Authorizing Provider  albuterol (PROVENTIL HFA;VENTOLIN HFA) 108 (90 BASE) MCG/ACT inhaler Inhale 2 puffs into the lungs every 4 (four) hours. 07/25/15   Ambrose Finland, NP  amLODipine (NORVASC) 10 MG tablet Take 1 tablet (10 mg total) by mouth daily. 07/25/15   Ambrose Finland, NP  beclomethasone (QVAR) 80 MCG/ACT inhaler Inhale 2 puffs into the lungs 2 (two) times daily. 07/25/15   Ambrose Finland, NP  butalbital-acetaminophen-caffeine (FIORICET, ESGIC) [phone] MG tablet Take 1 tablet by mouth every 6 (six) hours as needed for headache. 09/29/17 09/29/18  Pricilla Loveless, MD  HYDROcodone-acetaminophen (NORCO/VICODIN) 5-325 MG per tablet Take 2 tablets by mouth every 4 (four) hours as needed. Patient not taking: Reported on 07/25/2015 07/15/15   Marisa Severin, MD  losartan (COZAAR) 50 MG tablet Take 1 tablet (50 mg total) by mouth daily. 07/25/15   Ambrose Finland, NP  Multiple Vitamins-Minerals (MULTI ADULT GUMMIES PO) Take 1 tablet by mouth every other day.     [provider]  multivitamin (GERI-TONIC) LIQD Take 15 mLs by mouth once a week.  [provider]  naproxen sodium (ANAPROX) 220 MG tablet Take 1 tablet (220 mg total) by mouth 2 (two) times daily with a meal. 07/15/15   Marisa Severin, MD  sulfamethoxazole-trimethoprim (BACTRIM DS,SEPTRA DS) 800-160 MG per tablet Take 1 tablet by mouth 2 (two) times daily. Patient not taking: Reported on 08/20/2015 07/25/15   Ambrose Finland, NP  traMADol (ULTRAM) 50 MG tablet Take 1 tablet (50 mg total) by mouth every 8 (eight) hours as needed. 07/25/15   Ambrose Finland, NP  Vitamin D, Ergocalciferol, (DRISDOL) 50000 UNITS CAPS capsule Take 1 capsule (50,000 Units total) by mouth every 7  (seven) days. 08/01/15   Ambrose Finland, NP    Family History Family History  Problem Relation Age of Onset  . Hypertension Mother   . Heart attack Mother   . Heart attack Father     Social History Social History  Substance Use Topics  . Smoking status: Former Games developer  . Smokeless tobacco: Never Used  . Alcohol use Yes     Comment: occ     Allergies   Benadryl [diphenhydramine hcl]; Other; and Penicillins   Review of Systems Review of Systems  Constitutional: Negative for fever.  Eyes: Positive for photophobia. Negative for visual disturbance.  Respiratory: Negative for shortness of breath.   Cardiovascular: Positive for chest pain.  Gastrointestinal: Positive for nausea. Negative for abdominal pain and vomiting.  Musculoskeletal: Positive for neck pain. Negative for neck stiffness.  Neurological: Positive for weakness, numbness and headaches.  All other systems reviewed and are negative.    Physical Exam Updated Vital Signs BP (!) 142/89   Pulse (!) 59   Temp 99.2 F (37.3 C) (Oral)   Resp 18   LMP 09/19/2017 Comment: shielded  SpO2 99%   Physical Exam  Constitutional: She is oriented to person, place, and time. She appears well-developed and well-nourished. No distress.  HENT:  Head: Normocephalic and atraumatic.  Right Ear: External ear normal.  Left Ear: External ear normal.  Nose: Nose normal.  Eyes: Pupils are equal, round, and reactive to light. EOM are normal. Right eye exhibits no discharge. Left eye exhibits no discharge.  photophobia  Neck: Normal range of motion. Neck supple.  Cardiovascular: Normal rate, regular rhythm and normal heart sounds.   Pulmonary/Chest: Effort normal and breath sounds normal.  Abdominal: Soft. She exhibits no distension. There is no tenderness.  Neurological: She is alert and oriented to person, place, and time.  CN 3-12 grossly intact save for decreased sensation to light touch over left face diffusely. 5/5 strength  in all 4 extremities but slight weakness in LUE. Mild pronator drift in LUE. Diffuse decreased sensation in LUE. Normal finger to nose.   Skin: Skin is warm and dry. She is not diaphoretic.  Nursing note and vitals reviewed.    ED Treatments / Results  Labs (all labs ordered are listed, but only abnormal results are displayed) Labs Reviewed  CBC - Abnormal; Notable for the following:       Result Value   Hemoglobin 10.9 (*)    MCH 25.5 (*)    RDW 15.8 (*)    All other components within normal limits  PROTIME-INR  APTT  DIFFERENTIAL  COMPREHENSIVE METABOLIC PANEL  I-STAT TROPONIN, ED  I-STAT CHEM 8, ED  CBG MONITORING, ED    EKG  EKG Interpretation  Date/Time:  Thursday September 29 2017 12:22:27 EDT Ventricular Rate:  71 PR Interval:  140 QRS Duration: 82 QT Interval:  411 QTC Calculation: 447 R Axis:   83 Text Interpretation:  Normal sinus rhythm Borderline T wave abnormalities Artifact Confirmed by Pricilla Loveless [phone]) on 09/29/2017 12:31:38 PM       Radiology Dg Chest 2 View  Result Date: 09/29/2017 CLINICAL DATA:  Headache, right jaw numbness EXAM: CHEST  2 VIEW COMPARISON:  04/24/2014 FINDINGS: Heart is upper limits normal in size. Lungs are clear. No effusions or edema. No acute bone abnormality. IMPRESSION: Borderline heart size.  No active disease. Electronically Signed   By: Charlett Nose M.D.   On: 09/29/2017 15:32   Ct Head Wo Contrast  Result Date: 09/29/2017 CLINICAL DATA:  Pt c/o severe h/a above RIGHT eye, and down by her nose; pt.s RIGHT eye is tearing a lot also.Pt also has LEFT arm tingling, which started last night, and has worsened. Hx of HTN; no prior stroke; hx of migraines EXAM: CT HEAD WITHOUT CONTRAST TECHNIQUE: Contiguous axial images were obtained from the base of the skull through the vertex without intravenous contrast. COMPARISON:  Head CT dated 10/24/2013. FINDINGS: Brain: Ventricles are within normal limits in size and configuration.  All areas of the brain demonstrate normal gray-white matter differentiation. There is no mass, hemorrhage, edema or other evidence of acute parenchymal abnormality. No extra-axial hemorrhage. Incidental note is again made of dystrophic parafalcine calcifications. Vascular: No hyperdense vessel or unexpected calcification. Skull: Normal. Negative for fracture or focal lesion. Sinuses/Orbits: Periorbital and retro-orbital soft tissues are unremarkable. Orbital globes appear symmetric in position and configuration. Visualized upper paranasal sinuses are clear. Other: None. IMPRESSION: Negative head CT.  No intracranial mass, hemorrhage or edema. Electronically Signed   By: Bary Richard M.D.   On: 09/29/2017 11:52   Mr Brain Wo Contrast  Result Date: 09/29/2017 CLINICAL DATA:  Headache for 4 days. Migraines. Jaw numbness that went away. Left-sided numbness. EXAM: MRI HEAD WITHOUT CONTRAST TECHNIQUE: Multiplanar, multiecho pulse sequences of the brain and surrounding structures were obtained without intravenous contrast. COMPARISON:  Head CT from earlier today FINDINGS: Brain: No acute infarction, hemorrhage, hydrocephalus, extra-axial collection or mass lesion. Scattered FLAIR hyperintensities in the cerebral white matter, nonspecific but often attributed to microvascular insults given patient has history of hypertension. Complicated migraines could contribute given patient's history. No specific demyelinating pattern. On gradient imaging there is a wedge-shaped area of streaky hypointensity in the lateral left frontal white matter with prominent associated vein, features of a developmental venous anomaly. No associated hemorrhage or gliosis noted. Incidental prominent ossification of the falx. Vascular: Major flow voids are preserved Skull and upper cervical spine: Negative for marrow lesion Sinuses/Orbits: Negative IMPRESSION: 1. No acute finding. 2. Mild cerebral white matter disease presumably related to  patient's history of hypertension and migraines. 3. 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 Electronically Signed   By: Marnee Spring M.D.   On: 09/29/2017 14:50    Procedures Procedures (including critical care time)  Medications Ordered in ED Medications  ketorolac (TORADOL) 15 MG/ML injection 15 mg (15 mg Intravenous Given 09/29/17 1252)  sodium chloride 0.9 % bolus 1,000 mL (0 mLs Intravenous Stopped 09/29/17 1556)  promethazine (PHENERGAN) injection 25 mg (25 mg Intravenous Given 09/29/17 1250)     Initial Impression / Assessment and Plan / ED Course  I have reviewed the triage vital signs and the nursing notes.  Pertinent labs & imaging results that were available during my care of the patient were reviewed by me and considered in my medical decision  making (see chart for details).     Patient's exam is a little inconsistent on the weakness/numbness. With the headache that sounds like a migraine, the most likely cause is complicated migraine. D/w neuro, Dr Loretha Brasil, who asks for MRI to help r/o acute CVA. During this time her headache was treated and is now significantly better. Weakness/numbness improved. Highly doubt CVA, too long to be TIA. Chest fullness sensation is atypical as well, highly doubt ACS, PE or dissection. Given significant improvement, can be discharged home with outpatient neuro f/u. Discussed return precautions.   Final Clinical Impressions(s) / ED Diagnoses   Final diagnoses:  Hemiplegic migraine without status migrainosus, not intractable    New Prescriptions Discharge Medication List as of 09/29/2017  3:56 PM    START taking these medications   Details  butalbital-acetaminophen-caffeine (FIORICET, ESGIC) 50-325-40 MG tablet Take 1 tablet by mouth every 6 (six) hours as needed for headache., Starting Thu 09/29/2017, Until Fri 09/29/2018, Print         Pricilla Loveless, MD 09/29/17 1708

## 2017-09-29 NOTE — ED Notes (Signed)
Hooked patient back up to the monitor patient is resting with call bell at reach and family at bedside

## 2017-09-29 NOTE — ED Notes (Signed)
Patient transported to CT 

## 2017-09-29 NOTE — ED Notes (Signed)
PT states understanding of care given, follow up care, and medication prescribed. PT ambulated from ED to car with a steady gait. 

## 2017-09-29 NOTE — ED Triage Notes (Signed)
Pt states she has had headache for 4 days and has migraines.  She had jaw numbness last night then it went away.  Then she developed left sided numbness at 0530 this am and now complaining of chest pain.  Hx of MI

## 2018-03-27 IMAGING — CT CT HEAD W/O CM
3 of 4 series · 13 of 47 positions shown, 15 images · non-contrast
Comparison: Head CT dated 10/24/2013.

CLINICAL DATA: Pt c/o severe h/a above RIGHT eye, and down by her
nose; pt.s RIGHT eye is tearing a lot also.Pt also has LEFT arm
tingling, which started last night, and has worsened. Hx of HTN; no
prior stroke; hx of migraines

EXAM:
CT HEAD WITHOUT CONTRAST
TECHNIQUE: Contiguous axial images were obtained from the base of the skull
through the vertex without intravenous contrast.

[Series 3: head wo · axial · 0.46mm/px · z∈[+1273,+1393]mm · 7 of 32 slices shown, 9 images]
[im 4/32  brain]
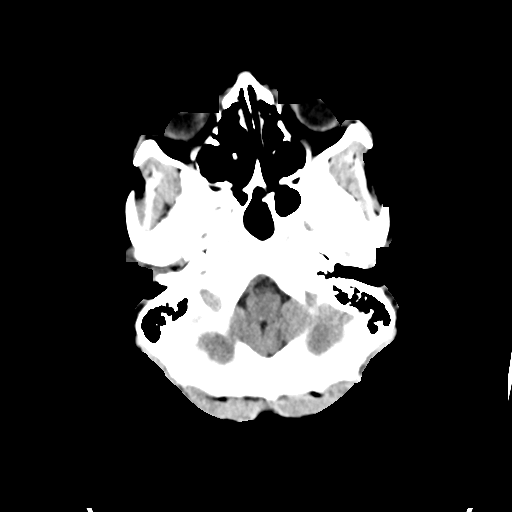
[im 4/32  bone]
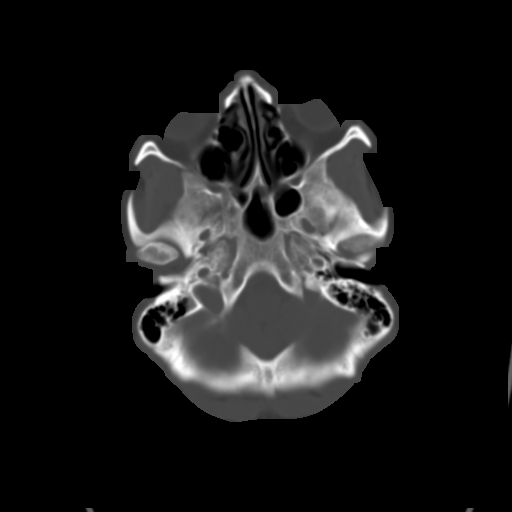
[im 8/32  brain]
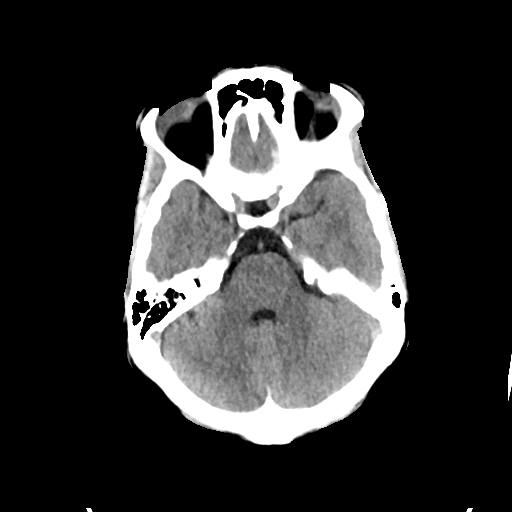
[im 12/32  brain]
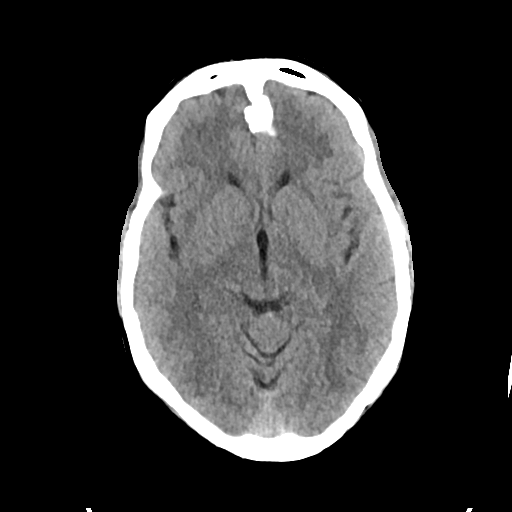
[im 16/32  brain]
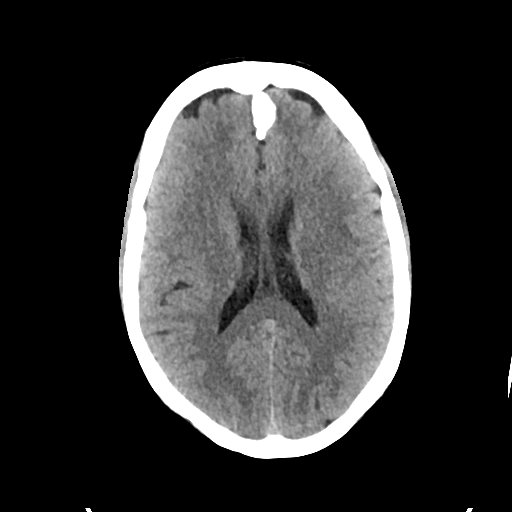
[im 20/32  brain]
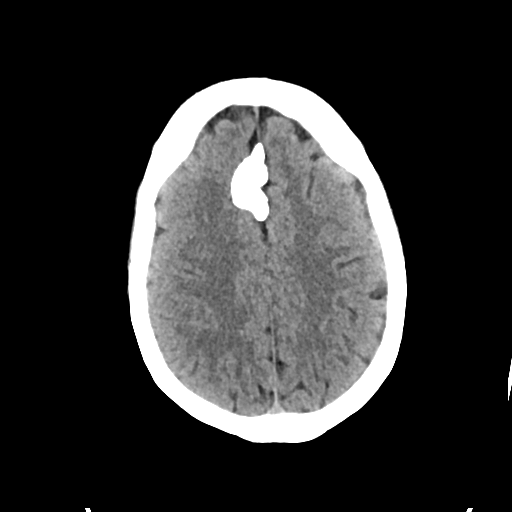
[im 20/32  bone]
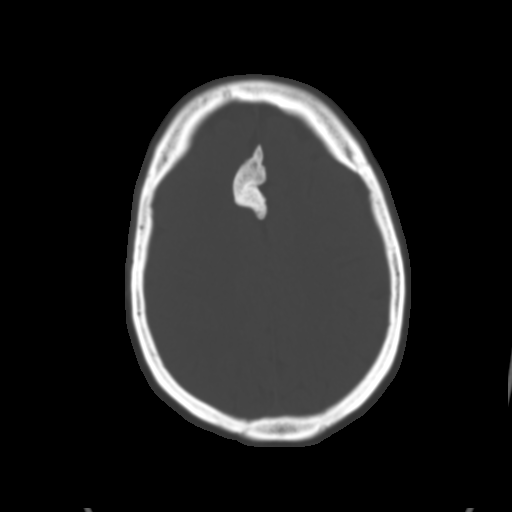
[im 24/32  brain]
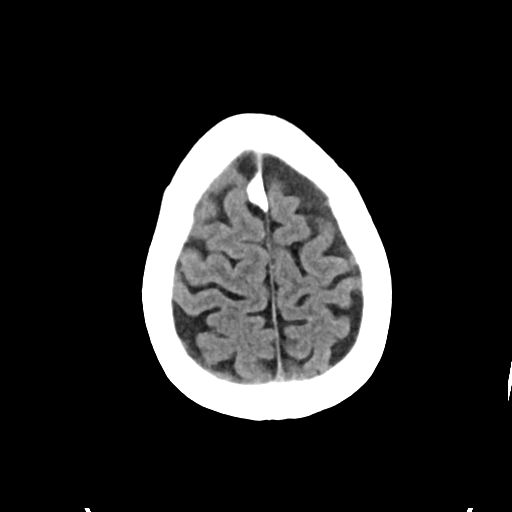
[im 28/32  brain]
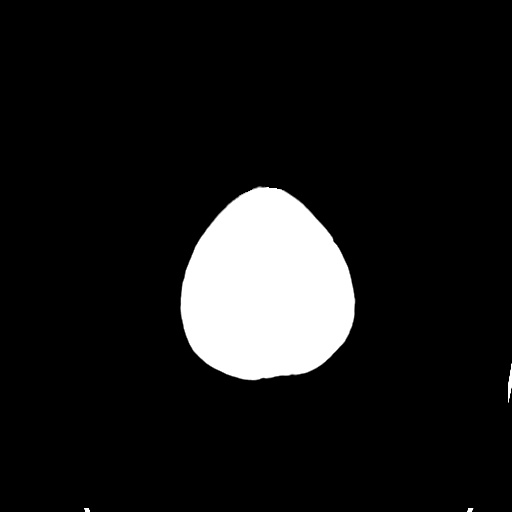

[Series 5: cor soft · coronal · 0.30mm/px · 3 of 67 slices shown]
[im 23/67  brain]
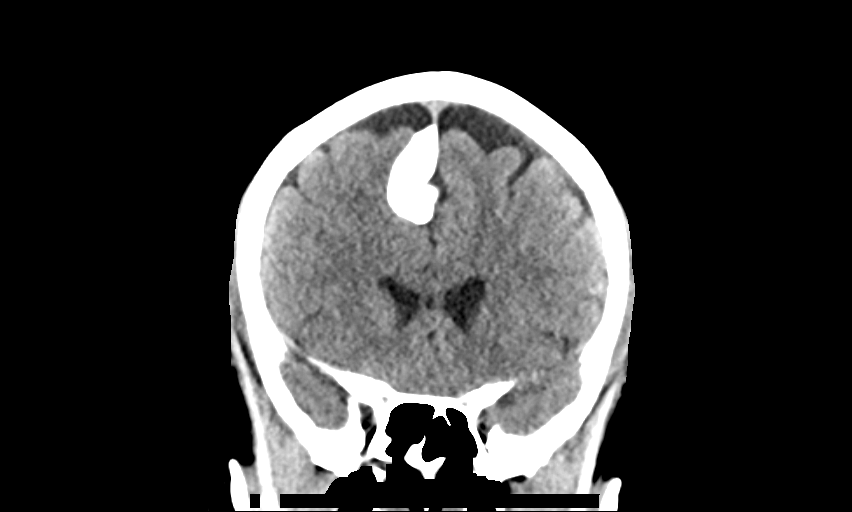
[im 30/67  brain]
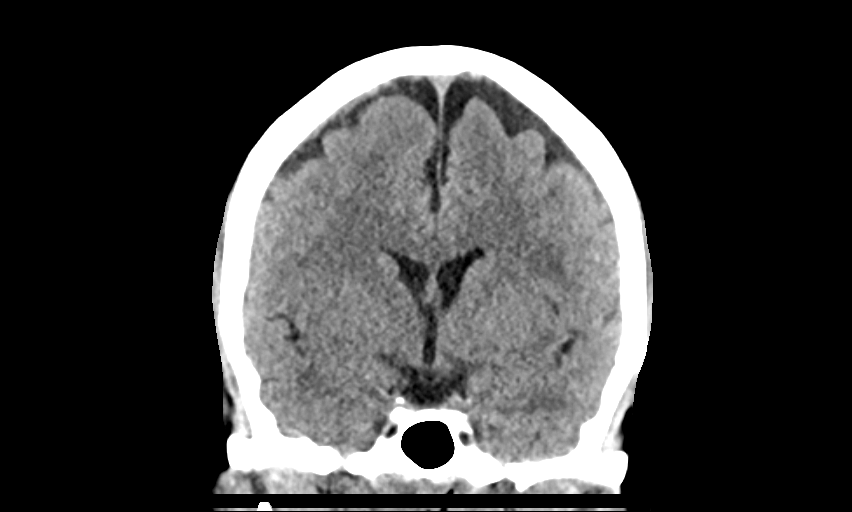
[im 37/67  brain]
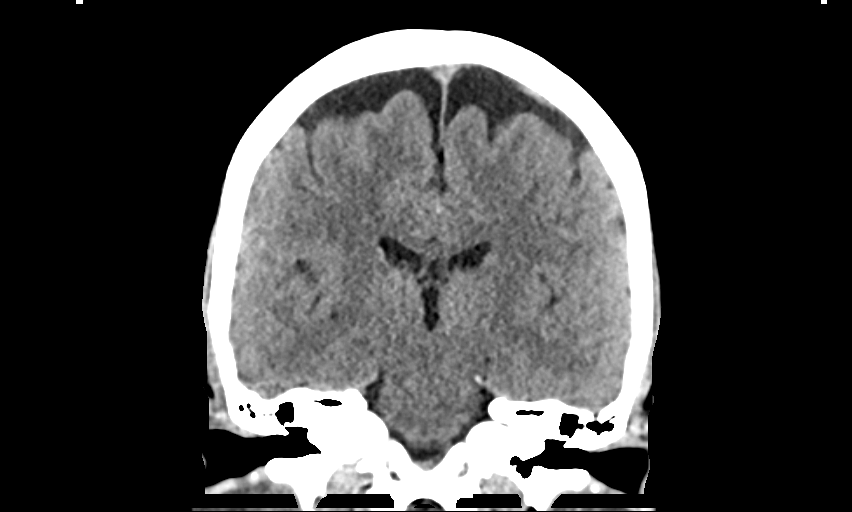

[Series 6: sag soft · sagittal · 0.36mm/px · 3 of 67 slices shown]
[im 23/67  brain]
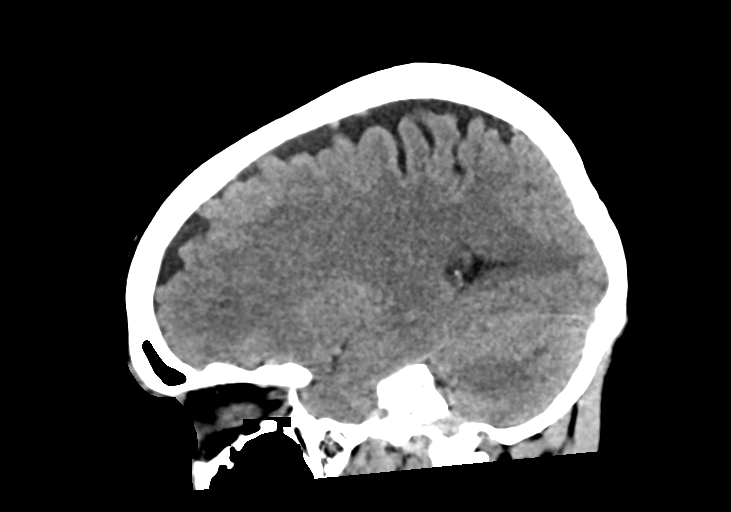
[im 34/67  brain]
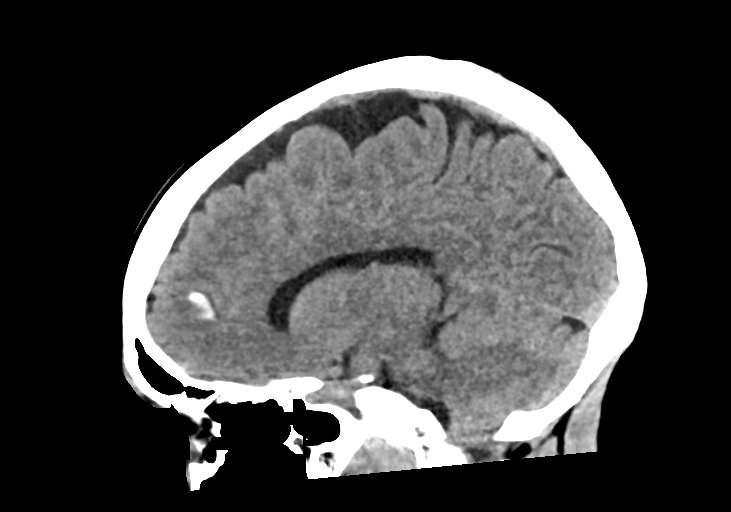
[im 45/67  brain]
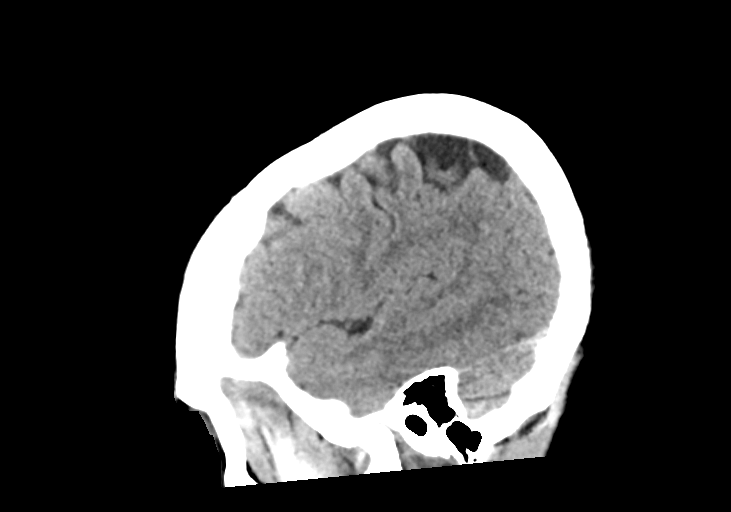

[13 of 47 positions shown; findings below may reference images not displayed]

FINDINGS: Brain: Ventricles are within normal limits in size and
configuration. All areas of the brain demonstrate normal gray-white
matter differentiation. There is no mass, hemorrhage, edema or other
evidence of acute parenchymal abnormality. No extra-axial
hemorrhage.

Incidental note is again made of dystrophic parafalcine
calcifications.

Vascular: No hyperdense vessel or unexpected calcification.

Skull: Normal. Negative for fracture or focal lesion.

Sinuses/Orbits: Periorbital and retro-orbital soft tissues are
unremarkable. Orbital globes appear symmetric in position and
configuration. Visualized upper paranasal sinuses are clear.

Other: None.
IMPRESSION: Negative head CT.  No intracranial mass, hemorrhage or edema.

## 2019-10-05 ENCOUNTER — Encounter (HOSPITAL_COMMUNITY): Payer: Self-pay | Admitting: Emergency Medicine

## 2019-10-05 ENCOUNTER — Emergency Department (HOSPITAL_COMMUNITY)
Admission: EM | Admit: 2019-10-05 | Discharge: 2019-10-05 | Disposition: A | Discharge status: Home / Self Care | Payer: Self-pay | Attending: Emergency Medicine | Admitting: Emergency Medicine

## 2019-10-05 DIAGNOSIS — M545 Low back pain: Secondary | ICD-10-CM | POA: Insufficient documentation

## 2019-10-05 DIAGNOSIS — J45909 Unspecified asthma, uncomplicated: Secondary | ICD-10-CM | POA: Insufficient documentation

## 2019-10-05 DIAGNOSIS — Y999 Unspecified external cause status: Secondary | ICD-10-CM | POA: Insufficient documentation

## 2019-10-05 DIAGNOSIS — Y92410 Unspecified street and highway as the place of occurrence of the external cause: Secondary | ICD-10-CM | POA: Insufficient documentation

## 2019-10-05 DIAGNOSIS — Y9389 Activity, other specified: Secondary | ICD-10-CM | POA: Insufficient documentation

## 2019-10-05 DIAGNOSIS — Z79899 Other long term (current) drug therapy: Secondary | ICD-10-CM | POA: Insufficient documentation

## 2019-10-05 DIAGNOSIS — R202 Paresthesia of skin: Secondary | ICD-10-CM | POA: Insufficient documentation

## 2019-10-05 DIAGNOSIS — Z87891 Personal history of nicotine dependence: Secondary | ICD-10-CM | POA: Insufficient documentation

## 2019-10-05 DIAGNOSIS — I10 Essential (primary) hypertension: Secondary | ICD-10-CM | POA: Insufficient documentation

## 2019-10-05 DIAGNOSIS — M542 Cervicalgia: Secondary | ICD-10-CM | POA: Insufficient documentation

## 2019-10-05 DIAGNOSIS — M25511 Pain in right shoulder: Secondary | ICD-10-CM | POA: Insufficient documentation

## 2019-10-05 MED ORDER — CYCLOBENZAPRINE HCL 10 MG PO TABS: 10.0000 mg | ORAL_TABLET | Freq: Two times a day (BID) | ORAL | 0 refills | Status: DC | PRN

## 2019-10-05 MED ORDER — IBUPROFEN 600 MG PO TABS: 600.0000 mg | ORAL_TABLET | Freq: Four times a day (QID) | ORAL | 0 refills | Status: DC | PRN
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
## 2019-10-05 NOTE — ED Notes (Signed)
Pt verbally understood d/c instructions and prescription information. Pt stable and ambulatory upon d/c. 

## 2019-10-16 DIAGNOSIS — S161XXA Strain of muscle, fascia and tendon at neck level, initial encounter: Secondary | ICD-10-CM | POA: Insufficient documentation

## 2019-10-16 DIAGNOSIS — M5136 Other intervertebral disc degeneration, lumbar region: Secondary | ICD-10-CM | POA: Insufficient documentation

## 2019-10-16 DIAGNOSIS — M51369 Other intervertebral disc degeneration, lumbar region without mention of lumbar back pain or lower extremity pain: Secondary | ICD-10-CM | POA: Insufficient documentation

## 2019-11-23 ENCOUNTER — Other Ambulatory Visit: Payer: Self-pay

## 2019-11-23 DIAGNOSIS — Z20822 Contact with and (suspected) exposure to covid-19: Secondary | ICD-10-CM

## 2019-11-24 LAB — NOVEL CORONAVIRUS, NAA: SARS-CoV-2, NAA: NOT DETECTED

## 2021-06-14 ENCOUNTER — Emergency Department (HOSPITAL_BASED_OUTPATIENT_CLINIC_OR_DEPARTMENT_OTHER)
Admission: EM | Admit: 2021-06-14 | Discharge: 2021-06-14 | Disposition: A | Discharge status: Home / Self Care | Payer: Self-pay | Attending: Emergency Medicine | Admitting: Emergency Medicine

## 2021-06-14 ENCOUNTER — Emergency Department (HOSPITAL_BASED_OUTPATIENT_CLINIC_OR_DEPARTMENT_OTHER): Payer: Self-pay

## 2021-06-14 ENCOUNTER — Encounter (HOSPITAL_BASED_OUTPATIENT_CLINIC_OR_DEPARTMENT_OTHER): Payer: Self-pay | Admitting: *Deleted

## 2021-06-14 ENCOUNTER — Other Ambulatory Visit: Payer: Self-pay

## 2021-06-14 DIAGNOSIS — W52XXXA Crushed, pushed or stepped on by crowd or human stampede, initial encounter: Secondary | ICD-10-CM | POA: Insufficient documentation

## 2021-06-14 DIAGNOSIS — S97102A Crushing injury of unspecified left toe(s), initial encounter: Secondary | ICD-10-CM

## 2021-06-14 DIAGNOSIS — S97112A Crushing injury of left great toe, initial encounter: Secondary | ICD-10-CM | POA: Insufficient documentation

## 2021-06-14 DIAGNOSIS — I10 Essential (primary) hypertension: Secondary | ICD-10-CM | POA: Insufficient documentation

## 2021-06-14 DIAGNOSIS — Z7951 Long term (current) use of inhaled steroids: Secondary | ICD-10-CM | POA: Insufficient documentation

## 2021-06-14 DIAGNOSIS — J45909 Unspecified asthma, uncomplicated: Secondary | ICD-10-CM | POA: Insufficient documentation

## 2021-06-14 DIAGNOSIS — Z79899 Other long term (current) drug therapy: Secondary | ICD-10-CM | POA: Insufficient documentation

## 2021-06-14 DIAGNOSIS — F17210 Nicotine dependence, cigarettes, uncomplicated: Secondary | ICD-10-CM | POA: Insufficient documentation

## 2021-06-14 MED ORDER — NAPROXEN 250 MG PO TABS
500.0000 mg | ORAL_TABLET | Freq: Once | ORAL | Status: AC
Start: 2021-06-14 — End: 2021-06-14
  Administered 2021-06-14: 05:00:00 500 mg via ORAL
  Filled 2021-06-14: qty 2

## 2021-06-14 MED ORDER — OXYCODONE-ACETAMINOPHEN 5-325 MG PO TABS: 1.0000 | ORAL_TABLET | Freq: Four times a day (QID) | ORAL | 0 refills | Status: DC | PRN
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
## 2021-06-14 NOTE — ED Provider Notes (Signed)
[facility] Provider Note: Lowella Dell, MD, FACEP  CSN: [id number] MRN: [id number] ARRIVAL: 06/14/21 at 0321 ROOM: DB013/DB013   CHIEF COMPLAINT  Toe Injury   HISTORY OF PRESENT ILLNESS  06/14/21 3:55 AM Marilyn Owen is a 55 y.o. female who struck her left great toe 6 days ago then had that same toe stepped on by a coworker 2 days later.  She is having persistent pain in that toe which is throbbing in nature.  She currently rates it as a 3 out of 10, but it is worse with ambulation or palpation.  She is still able to move and feel the toe.  She noticed that it became ecchymotic over the last day or two.   Past Medical History:  Diagnosis Date   Asthma    Financial difficulties    Hypertension    MI (myocardial infarction) (HCC)    Migraine headache    Murmur, cardiac     Past Surgical History:  Procedure Laterality Date   APPENDECTOMY     CARDIAC CATHETERIZATION     tubal ligation       Family History  Problem Relation Age of Onset   Hypertension Mother    Heart attack Mother    Heart attack Father     Social History   Tobacco Use   Smoking status: Every Day    Pack years: 0.00    Types: Cigarettes   Smokeless tobacco: Current  Substance Use Topics   Alcohol use: Yes    Comment: occ   Drug use: Yes    Types: Marijuana    Prior to Admission medications   Medication Sig Start Date End Date Taking? Authorizing Provider  oxyCODONE-acetaminophen (PERCOCET) 5-325 MG tablet Take 1 tablet by mouth every 6 (six) hours as needed for severe pain. 06/14/21  Yes Ahja Martello, MD  albuterol (PROVENTIL HFA;VENTOLIN HFA) 108 (90 BASE) MCG/ACT inhaler Inhale 2 puffs into the lungs every 4 (four) hours. 07/25/15   Ambrose Finland, NP  amLODipine (NORVASC) 10 MG tablet Take 1 tablet (10 mg total) by mouth daily. 07/25/15   Ambrose Finland, NP  Multiple Vitamins-Minerals (MULTI ADULT GUMMIES PO) Take 1 tablet by mouth every other day.     [provider]   multivitamin (GERI-TONIC) LIQD Take 15 mLs by mouth once a week.    [provider]    Allergies Benadryl [diphenhydramine hcl], Other, and Penicillins   REVIEW OF SYSTEMS  Negative except as noted here or in the History of Present Illness.   PHYSICAL EXAMINATION  Initial Vital Signs Blood pressure (!) 167/112, pulse 77, temperature 97.9 F (36.6 C), temperature source Oral, resp. rate 20, height 5\' 2"  (1.575 m), weight 67.1 kg, last menstrual period 09/19/2017, SpO2 95 %.  Examination General: Well-developed, well-nourished female in no acute distress; appearance consistent with age of record HENT: normocephalic; atraumatic Eyes: Normal appearance Neck: supple Heart: regular rate and rhythm Lungs: clear to auscultation bilaterally Abdomen: soft; nondistended; nontender; bowel sounds present Extremities: No deformity; full range of motion; ecchymosis and tenderness of left great toe, toe is neurovascularly intact but range of motion is limited Neurologic: Awake, alert and oriented; motor function intact in all extremities and symmetric; no facial droop Skin: Warm and dry Psychiatric: Normal mood and affect   RESULTS  Summary of this visit's results, reviewed and interpreted by myself:   EKG Interpretation  Date/Time:    Ventricular Rate:    PR Interval:    QRS Duration:  QT Interval:    QTC Calculation:   R Axis:     Text Interpretation:          Laboratory Studies: No results found for this or any previous visit (from the past 24 hour(s)). Imaging Studies: DG Toe Great Left  Result Date: 06/14/2021 CLINICAL DATA:  Initial evaluation for acute crush injury.  Pain. EXAM: LEFT GREAT TOE COMPARISON:  None. FINDINGS: There is no evidence of fracture or dislocation. There is no evidence of arthropathy or other focal bone abnormality. Soft tissues are unremarkable. IMPRESSION: Negative. Electronically Signed   By: Rise Mu M.D.   On: 06/14/2021  04:49    ED COURSE and MDM  Nursing notes, initial and subsequent vitals signs, including pulse oximetry, reviewed and interpreted by myself.  Vitals:   06/14/21 0331 06/14/21 0337  BP:  (!) 167/112  Pulse:  77  Resp:  20  Temp:  97.9 F (36.6 C)  TempSrc:  Oral  SpO2:  95%  Weight: 67.1 kg   Height: 5\' 2"  (1.575 m)    Medications  naproxen (NAPROSYN) tablet 500 mg (has no administration in time range)    No fracture seen on radiographs.  Toe is not red or warm so I do not suspect an infection.  We will treat her with a postop shoe and analgesics.  PROCEDURES  Procedures   ED DIAGNOSES     ICD-10-CM   1. Crush injury, toe, left, initial encounter  S97.102A          Emree Locicero, , MD 06/14/21 [id number]

## 2021-07-06 ENCOUNTER — Emergency Department (HOSPITAL_BASED_OUTPATIENT_CLINIC_OR_DEPARTMENT_OTHER): Payer: Self-pay | Admitting: Radiology

## 2021-07-06 ENCOUNTER — Encounter (HOSPITAL_BASED_OUTPATIENT_CLINIC_OR_DEPARTMENT_OTHER): Payer: Self-pay | Admitting: *Deleted

## 2021-07-06 ENCOUNTER — Other Ambulatory Visit: Payer: Self-pay

## 2021-07-06 ENCOUNTER — Emergency Department (HOSPITAL_BASED_OUTPATIENT_CLINIC_OR_DEPARTMENT_OTHER)
Admission: EM | Admit: 2021-07-06 | Discharge: 2021-07-06 | Disposition: A | Discharge status: Home / Self Care | Payer: Self-pay | Attending: Emergency Medicine | Admitting: Emergency Medicine

## 2021-07-06 DIAGNOSIS — F17210 Nicotine dependence, cigarettes, uncomplicated: Secondary | ICD-10-CM | POA: Insufficient documentation

## 2021-07-06 DIAGNOSIS — Z79899 Other long term (current) drug therapy: Secondary | ICD-10-CM | POA: Insufficient documentation

## 2021-07-06 DIAGNOSIS — J45909 Unspecified asthma, uncomplicated: Secondary | ICD-10-CM | POA: Insufficient documentation

## 2021-07-06 DIAGNOSIS — Z20822 Contact with and (suspected) exposure to covid-19: Secondary | ICD-10-CM | POA: Insufficient documentation

## 2021-07-06 DIAGNOSIS — I10 Essential (primary) hypertension: Secondary | ICD-10-CM | POA: Insufficient documentation

## 2021-07-06 DIAGNOSIS — R197 Diarrhea, unspecified: Secondary | ICD-10-CM | POA: Insufficient documentation

## 2021-07-06 DIAGNOSIS — R079 Chest pain, unspecified: Secondary | ICD-10-CM

## 2021-07-06 LAB — CBC
HCT: 45 % (ref 36.0–46.0)
Hemoglobin: 14.6 g/dL (ref 12.0–15.0)
MCH: 30.6 pg (ref 26.0–34.0)
MCHC: 32.4 g/dL (ref 30.0–36.0)
MCV: 94.3 fL (ref 80.0–100.0)
Platelets: 204 10*3/uL (ref 150–400)
RBC: 4.77 MIL/uL (ref 3.87–5.11)
RDW: 13.3 % (ref 11.5–15.5)
WBC: 6.5 10*3/uL (ref 4.0–10.5)
nRBC: 0 % (ref 0.0–0.2)

## 2021-07-06 LAB — TROPONIN I (HIGH SENSITIVITY)
Troponin I (High Sensitivity): 3 ng/L (ref <18)
Troponin I (High Sensitivity): 2 ng/L (ref <18)

## 2021-07-06 LAB — PREGNANCY, URINE: Preg Test, Ur: NEGATIVE

## 2021-07-06 LAB — BASIC METABOLIC PANEL
Anion gap: 10 (ref 5–15)
BUN: 13 mg/dL (ref 6–20)
CO2: 25 mmol/L (ref 22–32)
Calcium: 9 mg/dL (ref 8.9–10.3)
Chloride: 104 mmol/L (ref 98–111)
Creatinine, Ser: 0.81 mg/dL (ref 0.44–1.00)
GFR, Estimated: >60 mL/min (ref >60)
Glucose, Bld: 91 mg/dL (ref 70–99)
Potassium: 3.7 mmol/L (ref 3.5–5.1)
Sodium: 139 mmol/L (ref 135–145)

## 2021-07-06 LAB — RESP PANEL BY RT-PCR (FLU A&B, COVID) ARPGX2
Influenza A by PCR: NEGATIVE
Influenza B by PCR: NEGATIVE
SARS Coronavirus 2 by RT PCR: NEGATIVE

## 2021-07-06 LAB — D-DIMER, QUANTITATIVE: D-Dimer, Quant: 0.44 ug/mL-FEU (ref 0.00–0.50)

## 2021-07-06 MED ORDER — AMLODIPINE BESYLATE 10 MG PO TABS: 10.0000 mg | ORAL_TABLET | Freq: Every day | ORAL | 1 refills | Status: DC

## 2021-07-06 MED ORDER — ALBUTEROL SULFATE HFA 108 (90 BASE) MCG/ACT IN AERS
2.0000 | INHALATION_SPRAY | RESPIRATORY_TRACT | Status: DC | PRN
  Administered 2021-07-06: 2 via RESPIRATORY_TRACT
  Filled 2021-07-06: qty 6.7

## 2021-07-06 MED ORDER — AMLODIPINE BESYLATE 5 MG PO TABS
5.0000 mg | ORAL_TABLET | Freq: Once | ORAL | Status: AC
  Administered 2021-07-06: 5 mg via ORAL
  Filled 2021-07-06: qty 1

## 2021-07-06 NOTE — ED Triage Notes (Addendum)
Developed left chest pain last night, dizziness, diarrhea, pain in left neck. Felt hot as if she was going to pass out, diaphoresis with symptoms. Tingling in fingers and feet.

## 2021-07-06 NOTE — ED Provider Notes (Signed)
[facility] EMERGENCY DEPT Provider Note   CSN: [id number] Arrival date & time: 07/06/21  1415     History Chief Complaint  Patient presents with   Chest Pain   Diarrhea    Marilyn Owen is a 55 y.o. female.  Patient is a 55 year old female with a history of hypertension, prior MI, heart murmur, migraines, asthma who is presenting today with several complaints.  Patient reports that all of her symptoms started yesterday.  Last night she noticed there was a squeezing pressure type pain in the left upper chest and back that started at rest and persisted as well as feeling hot, dizzy and had some diarrhea.  She reports the symptoms lasted for a while and then let up and she was able to go to sleep but when she woke up this morning the symptoms were present again.  She got up and try to get ready for work but her symptoms got worse and she called out of work.  She continues to have the pressure in the left side of her chest and back.  She denies any cough or wheezing.  Several more episodes of diarrhea today but no localized abdominal pain, nausea or vomiting.  When she would start getting dizzy she would feel sweaty and her fingers were tingle but she did not lose consciousness.  She does smoke cigarettes 1 a day but also uses marijuana.  She denies any other drug use or alcohol use.  She has not had recent cough or fever.  She does not feel congested.  No known sick contacts.  No bad food exposure.  No recent immobilization.  No unilateral leg pain or swelling.  No recent medication changes.  She did take her amlodipine this morning 5 mg but reports that her blood pressure is high even with that dosage of medication.  Also has a strong family history of her mother, grandmother and maternal aunts all with blood clots on blood thinners but is unsure if there is a clotting disorder.  She does not have an inhaler at home to use so has not tried using that as she ran out.  She does report the  chest pain is worse when she stretches her arms or takes a deep breath so she tries not to do those things.  She denies any recent trauma.  The history is provided by the patient.  Chest Pain Diarrhea     Past Medical History:  Diagnosis Date   Asthma    Financial difficulties    Hypertension    MI (myocardial infarction) (HCC)    Migraine headache    Murmur, cardiac     Patient Active Problem List   Diagnosis Date Noted   Orthostatic hypotension 07/26/2015   Bradycardia 07/26/2015   Dizziness 07/26/2015   Multiple falls 07/26/2015   Extrinsic asthma with exacerbation 05/11/2012   HTN (hypertension) 09/22/2010    Past Surgical History:  Procedure Laterality Date   APPENDECTOMY     CARDIAC CATHETERIZATION     tubal ligation        OB History   No obstetric history on file.     Family History  Problem Relation Age of Onset   Hypertension Mother    Heart attack Mother    Heart attack Father     Social History   Tobacco Use   Smoking status: Every Day    Types: Cigarettes   Smokeless tobacco: Current  Substance Use Topics   Alcohol use: Yes  Comment: occ   Drug use: Yes    Types: Marijuana    Home Medications Prior to Admission medications   Medication Sig Start Date End Date Taking? Authorizing Provider  amLODipine (NORVASC) 10 MG tablet Take 1 tablet (10 mg total) by mouth daily. 07/25/15  Yes Ambrose FinlandKeck, Valerie A, NP  oxyCODONE-acetaminophen (PERCOCET) 5-325 MG tablet Take 1 tablet by mouth every 6 (six) hours as needed for severe pain. 06/14/21  Yes Molpus, John, MD  albuterol (PROVENTIL HFA;VENTOLIN HFA) 108 (90 BASE) MCG/ACT inhaler Inhale 2 puffs into the lungs every 4 (four) hours. 07/25/15   Ambrose FinlandKeck, Valerie A, NP  Multiple Vitamins-Minerals (MULTI ADULT GUMMIES PO) Take 1 tablet by mouth every other day.     [provider]  multivitamin (GERI-TONIC) LIQD Take 15 mLs by mouth once a week.    [provider]    Allergies    Benadryl  [diphenhydramine hcl], Other, and Penicillins  Review of Systems   Review of Systems  Cardiovascular:  Positive for chest pain.  Gastrointestinal:  Positive for diarrhea.  All other systems reviewed and are negative.  Physical Exam Updated Vital Signs BP (!) 221/128 (BP Location: Right Arm)   Pulse 63   Temp 98.5 F (36.9 C) (Oral)   Resp 17   Ht 5\' 2"  (1.575 m)   Wt 70.3 kg   LMP 09/19/2017 Comment: shielded  SpO2 100%   BMI 28.35 kg/m   Physical Exam Vitals and nursing note reviewed.  Constitutional:      General: She is not in acute distress.    Appearance: She is well-developed.  HENT:     Head: Normocephalic and atraumatic.     Nose: Nose normal.     Mouth/Throat:     Mouth: Mucous membranes are moist.  Eyes:     Pupils: Pupils are equal, round, and reactive to light.  Cardiovascular:     Rate and Rhythm: Normal rate and regular rhythm.     Pulses: Normal pulses.     Heart sounds: Murmur heard.  Systolic murmur is present with a grade of 2/6.    No friction rub.  Pulmonary:     Effort: Pulmonary effort is normal.     Breath sounds: Decreased breath sounds present. No wheezing or rales.  Abdominal:     General: Bowel sounds are normal. There is no distension.     Palpations: Abdomen is soft.     Tenderness: There is no abdominal tenderness. There is no guarding or rebound.  Musculoskeletal:        General: No tenderness. Normal range of motion.     Cervical back: Normal range of motion and neck supple. No tenderness.     Right lower leg: No edema.     Left lower leg: No edema.     Comments: No edema  Skin:    General: Skin is warm and dry.     Findings: No rash.  Neurological:     Mental Status: She is alert and oriented to person, place, and time. Mental status is at baseline.     Cranial Nerves: No cranial nerve deficit.  Psychiatric:        Mood and Affect: Mood normal.        Behavior: Behavior normal.    ED Results / Procedures / Treatments    Labs (all labs ordered are listed, but only abnormal results are displayed) Labs Reviewed  RESP PANEL BY RT-PCR (FLU A&B, COVID) [id number]  BASIC METABOLIC  PANEL  CBC  PREGNANCY, URINE  D-DIMER, QUANTITATIVE  TROPONIN I (HIGH SENSITIVITY)  TROPONIN I (HIGH SENSITIVITY)    EKG EKG Interpretation  Date/Time:  Monday July 06 2021 14:26:08 EDT Ventricular Rate:  60 PR Interval:  146 QRS Duration: 80 QT Interval:  465 QTC Calculation: 465 R Axis:   64 Text Interpretation: Sinus rhythm No significant change since last tracing Confirmed by Gwyneth Sprout ([id number]) on 07/06/2021 3:09:43 PM  Radiology DG Chest 2 View  Result Date: 07/06/2021 CLINICAL DATA:  Chest pain and shortness of breath. EXAM: CHEST - 2 VIEW COMPARISON:  PA and lateral chest 09/29/2017. FINDINGS: Lungs clear. Heart size normal. No pneumothorax or pleural fluid. No acute or focal bony abnormality. IMPRESSION: Negative chest. Electronically Signed   By: Drusilla Kanner M.D.   On: 07/06/2021 15:15    Procedures Procedures   Medications Ordered in ED Medications  amLODipine (NORVASC) tablet 5 mg (has no administration in time range)  albuterol (VENTOLIN HFA) 108 (90 Base) MCG/ACT inhaler 2 puff (has no administration in time range)    ED Course  I have reviewed the triage vital signs and the nursing notes.  Pertinent labs & imaging results that were available during my care of the patient were reviewed by me and considered in my medical decision making (see chart for details).    MDM Rules/Calculators/A&P                          Patient is a pleasant 55 year old female presenting today with several complaints.  Most immediate complaint is of a squeezing chest pain in the left side of her chest that is pleuritic and worse with movement.  She has mild decreased breath sounds but no other focal complaints.  She has not had a cough or wheezing.  She denies fever but has had diarrhea in the last 24 hours.  She has  no focal abdominal tenderness here concerning for diverticulitis appendicitis or other acute abdominal process.  Patient's blood pressure is elevated here most recent number is 174/113.  She did take 5 mg of amlodipine earlier today when she woke up.  She does report that her blood pressure is usually elevated between 1 60-1 70 even with the amlodipine.  Patient's EKG today without acute changes.  Chest x-ray is within normal limits.  CBC and BMP and troponin are all normal.  Patient's chest pain is nonspecific.  Low suspicion for ACS at this time given patient's symptoms have been present for greater than 12 hours and troponin is negative and EKG is unchanged.  It is pleuritic in nature and worsened with certain movements.  Concern for possible asthma exacerbation just without wheezing.  Also concern for her hypertension but feel this is more chronic.  Also with the diarrhea concern for possible COVID.  D-dimer is negative with a lower suspicion for PE.  Chest x-ray is clear without signs of pneumonia.  We will give a second dose of 5 mg of amlodipine.  Will give patient albuterol to see if that helps with her symptoms.  COVID test is pending.  12:12 AM Labs are reassuring.  Pt's symptoms improved after inhaler.  Bp improved with additional amlodipine.  We will have patient increase amlodipine to 10 mg daily.  No evidence to suggest ACS at this time.  Patient in general is feeling better.  Will discharge home to follow-up with health and wellness.  Patient given return precautions.  MDM  Amount and/or Complexity of Data Reviewed Clinical lab tests: ordered and reviewed Tests in the radiology section of CPT: ordered and reviewed Tests in the medicine section of CPT: ordered and reviewed Independent visualization of images, tracings, or specimens: yes  Patient Progress Patient progress: stable    Final Clinical Impression(s) / ED Diagnoses Final diagnoses:  Hypertension, unspecified type   Nonspecific chest pain    Rx / DC Orders ED Discharge Orders          Ordered    amLODipine (NORVASC) 10 MG tablet  Daily        07/06/21 1745             Gwyneth Sprout, MD 07/07/21 0014

## 2021-07-06 NOTE — Discharge Instructions (Signed)
Your blood pressure did improve with 10 mg of amlodipine so start taking 10 mg daily.  You may also have a viral illness with the diarrhea and chest discomfort.  There is no sign of heart attack or blood clots today.  Your x-ray does not show any signs of pneumonia.  You can use the inhaler as needed.

## 2021-07-08 ENCOUNTER — Other Ambulatory Visit: Payer: Self-pay

## 2021-07-08 ENCOUNTER — Ambulatory Visit: Payer: Self-pay | Attending: Physician Assistant | Admitting: Physician Assistant

## 2021-07-08 ENCOUNTER — Encounter: Payer: Self-pay | Admitting: Critical Care Medicine

## 2021-07-08 DIAGNOSIS — I10 Essential (primary) hypertension: Secondary | ICD-10-CM

## 2021-07-08 DIAGNOSIS — J45901 Unspecified asthma with (acute) exacerbation: Secondary | ICD-10-CM

## 2021-07-08 DIAGNOSIS — M62838 Other muscle spasm: Secondary | ICD-10-CM

## 2021-07-08 DIAGNOSIS — Z09 Encounter for follow-up examination after completed treatment for conditions other than malignant neoplasm: Secondary | ICD-10-CM

## 2021-07-08 DIAGNOSIS — R0781 Pleurodynia: Secondary | ICD-10-CM

## 2021-07-08 MED ORDER — METHOCARBAMOL 500 MG PO TABS
1000.0000 mg | ORAL_TABLET | Freq: Three times a day (TID) | ORAL | 0 refills | Status: DC | PRN
  Filled 2021-07-08: qty 90, 15d supply, fill #0

## 2021-07-08 MED ORDER — IBUPROFEN 600 MG PO TABS
600.0000 mg | ORAL_TABLET | Freq: Three times a day (TID) | ORAL | 0 refills | Status: DC | PRN
  Filled 2021-07-08: qty 60, 20d supply, fill #0

## 2021-07-08 NOTE — Progress Notes (Signed)
Patient ID: Marilyn Owen, female   DOB: Jan 17, 1966, 55 y.o.   MRN: [id number]  Virtual Visit via Telephone Note  I connected with Lovett Sox on 07/08/21 at  2:50 PM EDT by telephone and verified that I am speaking with the correct person using two identifiers.  Location: Patient: home Provider: [facility] office   I discussed the limitations, risks, security and privacy concerns of performing an evaluation and management service by telephone and the availability of in person appointments. I also discussed with the patient that there may be a patient responsible charge related to this service. The patient expressed understanding and agreed to proceed.   History of Present Illness: After ED visit 07/06/2021 for CP and SOB.  Work-up was neg/reassuring..  Breathing better with using inhaler.  BP is improving on increased dose of amlodipine.  She is still having pleuritic CP with movement and with taking a deep breath.  Tylenol is not helping much.  No paresthesias.  No chest pressure.  No radiating pain. Does not feel up to working  From ED note: Patient is a pleasant 55 year old female presenting today with several complaints.  Most immediate complaint is of a squeezing chest pain in the left side of her chest that is pleuritic and worse with movement.  She has mild decreased breath sounds but no other focal complaints.  She has not had a cough or wheezing.  She denies fever but has had diarrhea in the last 24 hours.  She has no focal abdominal tenderness here concerning for diverticulitis appendicitis or other acute abdominal process.  Patient's blood pressure is elevated here most recent number is 174/113.  She did take 5 mg of amlodipine earlier today when she woke up.  She does report that her blood pressure is usually elevated between 1 60-1 70 even with the amlodipine.  Patient's EKG today without acute changes.  Chest x-ray is within normal limits.  CBC and BMP and troponin are all normal.  Patient's  chest pain is nonspecific.  Low suspicion for ACS at this time given patient's symptoms have been present for greater than 12 hours and troponin is negative and EKG is unchanged.  It is pleuritic in nature and worsened with certain movements.  Concern for possible asthma exacerbation just without wheezing.  Also concern for her hypertension but feel this is more chronic.  Also with the diarrhea concern for possible COVID.  D-dimer is negative with a lower suspicion for PE.  Chest x-ray is clear without signs of pneumonia.  We will give a second dose of 5 mg of amlodipine.  Will give patient albuterol to see if that helps with her symptoms.  COVID test is pending.   12:12 AM Labs are reassuring.  Pt's symptoms improved after inhaler.  Bp improved with additional amlodipine.  We will have patient increase amlodipine to 10 mg daily.  No evidence to suggest ACS at this time.  Patient in general is feeling better   Observations/Objective: NAD.  A&Ox3  Assessment and Plan: 1. Pleuritic chest pain - ibuprofen (ADVIL) 600 MG tablet; Take 1 tablet (600 mg total) by mouth every 8 (eight) hours as needed.  Dispense: 60 tablet; Refill: 0  2. Muscle spasm - methocarbamol (ROBAXIN) 500 MG tablet; Take 2 tablets (1,000 mg total) by mouth every 8 (eight) hours as needed for muscle spasms.  Dispense: 90 tablet; Refill: 0  3. Primary hypertension Continue amlodipine 10mg  daily and continue home BP readings  4. Extrinsic asthma with acute  exacerbation, unspecified asthma severity, unspecified whether persistent Inhaler prn  5. Encounter for examination following treatment at hospital Improving OOW note for this week  To ED if worsens.  Patient verbalizes understanding.     Follow Up Instructions: Assign PCP in 4-6 weeks for htn    I discussed the assessment and treatment plan with the patient. The patient was provided an opportunity to ask questions and all were answered. The patient agreed with the plan  and demonstrated an understanding of the instructions.   The patient was advised to call back or seek an in-person evaluation if the symptoms worsen or if the condition fails to improve as anticipated.  I provided 18 minutes of non-face-to-face time during this encounter.   Georgian Co, PA-C

## 2021-07-09 ENCOUNTER — Encounter: Payer: Self-pay | Admitting: Physician Assistant

## 2021-08-19 ENCOUNTER — Ambulatory Visit: Payer: Self-pay | Attending: Critical Care Medicine | Admitting: Critical Care Medicine

## 2021-08-19 ENCOUNTER — Other Ambulatory Visit: Payer: Self-pay

## 2021-08-19 ENCOUNTER — Encounter: Payer: Self-pay | Admitting: Critical Care Medicine

## 2021-08-19 VITALS — BP 142/90 | HR 62 | Resp 16 | Wt 168.4 lb

## 2021-08-19 DIAGNOSIS — Z720 Tobacco use: Secondary | ICD-10-CM | POA: Insufficient documentation

## 2021-08-19 DIAGNOSIS — J4520 Mild intermittent asthma, uncomplicated: Secondary | ICD-10-CM

## 2021-08-19 DIAGNOSIS — Z1211 Encounter for screening for malignant neoplasm of colon: Secondary | ICD-10-CM

## 2021-08-19 DIAGNOSIS — R55 Syncope and collapse: Secondary | ICD-10-CM

## 2021-08-19 DIAGNOSIS — I10 Essential (primary) hypertension: Secondary | ICD-10-CM

## 2021-08-19 DIAGNOSIS — R072 Precordial pain: Secondary | ICD-10-CM

## 2021-08-19 DIAGNOSIS — R9089 Other abnormal findings on diagnostic imaging of central nervous system: Secondary | ICD-10-CM

## 2021-08-19 DIAGNOSIS — H53453 Other localized visual field defect, bilateral: Secondary | ICD-10-CM

## 2021-08-19 DIAGNOSIS — J4540 Moderate persistent asthma, uncomplicated: Secondary | ICD-10-CM

## 2021-08-19 DIAGNOSIS — Z139 Encounter for screening, unspecified: Secondary | ICD-10-CM

## 2021-08-19 DIAGNOSIS — R296 Repeated falls: Secondary | ICD-10-CM

## 2021-08-19 DIAGNOSIS — Z6830 Body mass index (BMI) 30.0-30.9, adult: Secondary | ICD-10-CM

## 2021-08-19 MED ORDER — ALBUTEROL SULFATE HFA 108 (90 BASE) MCG/ACT IN AERS
2.0000 | INHALATION_SPRAY | RESPIRATORY_TRACT | 3 refills | Status: DC
  Filled 2021-08-19 – 2021-10-07 (×2): qty 18, 16d supply, fill #0

## 2021-08-19 MED ORDER — NICOTINE POLACRILEX 2 MG MT LOZG
2.0000 mg | LOZENGE | OROMUCOSAL | 0 refills | Status: DC | PRN
  Filled 2021-08-19: qty 100, fill #0

## 2021-08-19 MED ORDER — VALSARTAN 160 MG PO TABS
160.0000 mg | ORAL_TABLET | Freq: Every day | ORAL | 3 refills | Status: DC
  Filled 2021-08-19: qty 30, 30d supply, fill #0

## 2021-08-19 MED ORDER — AMLODIPINE BESYLATE 10 MG PO TABS
10.0000 mg | ORAL_TABLET | Freq: Every day | ORAL | 1 refills | Status: DC
  Filled 2021-08-19 – 2021-10-07 (×2): qty 30, 30d supply, fill #0

## 2021-08-19 MED ORDER — FLUTICASONE PROPIONATE HFA 44 MCG/ACT IN AERO
2.0000 | INHALATION_SPRAY | Freq: Every day | RESPIRATORY_TRACT | 12 refills | Status: DC
  Filled 2021-08-19: qty 10.6, 30d supply, fill #0

## 2021-08-19 NOTE — Assessment & Plan Note (Signed)
History of persistent falls starting in 2016 with episodes of syncope dizziness and bradycardia  Work-up in 2016 unrevealing  I am very concerned about visual field cuts at this office visit  She has multiple injuries on her feet  Plan to refer to neurology and cardiology for further evaluations she may need another brain MRI

## 2021-08-19 NOTE — Assessment & Plan Note (Signed)
Moderate persistent asthma without exacerbation  Begin Flovent 44 mcg strength 2 puffs daily and use albuterol as needed

## 2021-08-19 NOTE — Assessment & Plan Note (Signed)
Hypertension poorly controlled plan to add valsartan 160 mg daily to amlodipine and obtain lipid panel and metabolic panel

## 2021-08-19 NOTE — Patient Instructions (Signed)
Tetanus vaccine was given  Referral to cardiology and neurology will be made  Begin valsartan in 160 mg daily and stay on amlodipine 10 mg daily  Begin Flovent 2 puffs daily for your asthma and use albuterol as needed  Stop smoking and use the nicotine lozenge to replace the cigarettes you are smoking as we discussed  Follow a healthy diet as attached  Colon cancer screening kit was issued  Call the cancer control program to apply for free mammogram and pap smear, they will order the studies once approved The number is:   [phone]   Return to see Dr. Joya Gaskins in 6 weeks

## 2021-08-19 NOTE — Assessment & Plan Note (Signed)
Mild tobacco use    . Current smoking consumption amount: 1 cigarette a day  . Dicsussion on advise to quit smoking and smoking impacts: Cardiovascular lung impacts  . Patient's willingness to quit: Wants to quit  . Methods to quit smoking discussed: Behavioral modification medication management  . Medication management of smoking session drugs discussed: Nicotine lozenge  . Resources provided:  AVS   . Setting quit date 12 weeks  . Follow-up arranged 2 months   Time spent counseling the patient: 5 minutes

## 2021-08-19 NOTE — Progress Notes (Signed)
New Patient Office Visit  Subjective:  Patient ID: Marilyn [facility], female    DOB: 1966-08-31  Age: 55 y.o. MRN: [id number]  CC:  Chief Complaint  Patient presents with   Establish Care    HPI Marilyn [facility] presents for primary care to establish.  Previously had been seen in July by [facility] Documentation from that visit was below Saw  mcclung 06/2021 After ED visit 07/06/2021 for CP and SOB.  Work-up was neg/reassuring..  Breathing better with using inhaler.  BP is improving on increased dose of amlodipine.  She is still having pleuritic CP with movement and with taking a deep breath.  Tylenol is not helping much.  No paresthesias.  No chest pressure.  No radiating pain. Does not feel up to working   From ED note: Patient is a pleasant 55 year old female presenting today with several complaints.  Most immediate complaint is of a squeezing chest pain in the left side of her chest that is pleuritic and worse with movement.  She has mild decreased breath sounds but no other focal complaints.  She has not had a cough or wheezing.  She denies fever but has had diarrhea in the last 24 hours.  She has no focal abdominal tenderness here concerning for diverticulitis appendicitis or other acute abdominal process.  Patient's blood pressure is elevated here most recent number is 174/113.  She did take 5 mg of amlodipine earlier today when she woke up.  She does report that her blood pressure is usually elevated between 1 60-1 70 even with the amlodipine.  Patient's EKG today without acute changes.  Chest x-ray is within normal limits.  CBC and BMP and troponin are all normal.  Patient's chest pain is nonspecific.  Low suspicion for ACS at this time given patient's symptoms have been present for greater than 12 hours and troponin is negative and EKG is unchanged.  It is pleuritic in nature and worsened with certain movements.  Concern for possible asthma exacerbation just without wheezing.  Also concern for her  hypertension but feel this is more chronic.  Also with the diarrhea concern for possible COVID.  D-dimer is negative with a lower suspicion for PE.  Chest x-ray is clear without signs of pneumonia.  We will give a second dose of 5 mg of amlodipine.  Will give patient albuterol to see if that helps with her symptoms.  COVID test is pending.   12:12 AM Labs are reassuring.  Pt's symptoms improved after inhaler.  Bp improved with additional amlodipine.  We will have patient increase amlodipine to 10 mg daily.  No evidence to suggest ACS at this time.  Patient in general is feeling better   Observations/Objective: NAD.  A&Ox3   Assessment and Plan: 1. Pleuritic chest pain - ibuprofen (ADVIL) 600 MG tablet; Take 1 tablet (600 mg total) by mouth every 8 (eight) hours as needed.  Dispense: 60 tablet; Refill: 0   2. Muscle spasm - methocarbamol (ROBAXIN) 500 MG tablet; Take 2 tablets (1,000 mg total) by mouth every 8 (eight) hours as needed for muscle spasms.  Dispense: 90 tablet; Refill: 0   3. Primary hypertension Continue amlodipine 10mg  daily and continue home BP readings   4. Extrinsic asthma with acute exacerbation, unspecified asthma severity, unspecified whether persistent Inhaler prn   5. Encounter for examination following treatment at [facility] for this week   Since that visit the patient's chest pain has somewhat resolved she has had mild  wheezing with her albuterol use increased.  She maintains amlodipine 10 mg daily but on arrival today blood pressure still elevated 142/90.  She has episodes where she has gait disturbance and frequent falls.  She is fallen and hurt her foot.  Upon further inquiry she is having visual field cuts peripherally which make it difficult for her to ambulate.  Patient does continue to smoke about 1 cigarette a day.  Patient also had history of snoring and sleep apnea in the past but is not on CPAP therapy.  Patient is due a tetanus vaccine  and she will receive it.  She was screened for HIV and hepatitis C in 2010 was negative.  She had a cath at 55 years of age when she had a chest pain syndrome the catheter study was negative.  She was told she had an acute myocardial infarction at that time.  She is also had spells of syncope in the past with bradycardia.  She had a visit in 2016 with cardiology and had a 24-hour monitor placed with no significant events.  Echocardiogram was unremarkable.  Patient did have an MRI of the brain in 2018 as documented below showed mild white matter changes  Patient works in Clinical research associate at [employer]    Past Medical History:  Diagnosis Date   Asthma    Financial difficulties    Hypertension    MI (myocardial infarction) (HCC)    Migraine headache    Murmur, cardiac     Past Surgical History:  Procedure Laterality Date   APPENDECTOMY     CARDIAC CATHETERIZATION     tubal ligation       Family History  Problem Relation Age of Onset   Hypertension Mother    Heart attack Mother    Heart attack Father     Social History   Socioeconomic History   Marital status: Married    Spouse name: Not on file   Number of children: 4   Years of education: Not on file   Highest education level: Not on file  Occupational History   Not on file  Tobacco Use   Smoking status: Every Day    Types: Cigarettes   Smokeless tobacco: Current  Substance and Sexual Activity   Alcohol use: Yes    Comment: occ   Drug use: Yes    Types: Marijuana   Sexual activity: Not on file  Other Topics Concern   Not on file  Social History Narrative   Not on file   Social Determinants of Health   Financial Resource Strain: Not on file  Food Insecurity: Not on file  Transportation Needs: Not on file  Physical Activity: Not on file  Stress: Not on file  Social Connections: Not on file  Intimate Partner Violence: Not on file    ROS Review of Systems  HENT: Negative.    Eyes:  Positive for  visual disturbance.  Respiratory:  Positive for shortness of breath and wheezing. Negative for cough.   Cardiovascular:  Positive for chest pain and palpitations. Negative for leg swelling.  Gastrointestinal: Negative.   Genitourinary: Negative.   Musculoskeletal:  Positive for gait problem.  Neurological:  Positive for dizziness.  Hematological: Negative.   Psychiatric/Behavioral: Negative.     Objective:   Today's Vitals: BP (!) 142/90   Pulse 62   Resp 16   Wt 168 lb 6.4 oz (76.4 kg)   LMP 09/19/2017 Comment: shielded  SpO2 98%   BMI 30.80 kg/m  Physical Exam Constitutional:      General: She is not in acute distress.    Appearance: She is obese.  HENT:     Head: Normocephalic and atraumatic.     Nose: Nose normal.     Mouth/Throat:     Mouth: Mucous membranes are moist.     Pharynx: Oropharynx is clear.  Eyes:     Extraocular Movements: Extraocular movements intact.     Pupils: Pupils are equal, round, and reactive to light.     Comments: Apparent visual field cut bilaterally  Cardiovascular:     Rate and Rhythm: Normal rate and regular rhythm.     Pulses: Normal pulses.     Heart sounds: Normal heart sounds.  Pulmonary:     Effort: Pulmonary effort is normal.     Breath sounds: Normal breath sounds.  Abdominal:     General: Abdomen is flat. Bowel sounds are normal.     Palpations: Abdomen is soft.  Musculoskeletal:        General: Normal range of motion.  Skin:    General: Skin is warm and dry.  Neurological:     General: No focal deficit present.     Mental Status: She is alert and oriented to person, place, and time. Mental status is at baseline.     Cranial Nerves: No cranial nerve deficit.     Sensory: No sensory deficit.     Motor: No weakness.     Coordination: Coordination abnormal.     Gait: Gait abnormal.     Deep Tendon Reflexes: Reflexes normal.     Comments: Performs very poorly with finger-to-nose study  Psychiatric:        Mood and  Affect: Mood normal.        Behavior: Behavior normal.        Thought Content: Thought content normal.        Judgment: Judgment normal.    MRI 2018 IMPRESSION: 1. No acute finding. 2. Mild cerebral white matter disease presumably related to patient's history of hypertension and migraines. 3. 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 Assessment & Plan:   Problem List Items Addressed This Visit       Cardiovascular and Mediastinum   HTN (hypertension)    Hypertension poorly controlled plan to add valsartan 160 mg daily to amlodipine and obtain lipid panel and metabolic panel      Relevant Medications   valsartan (DIOVAN) 160 MG tablet   amLODipine (NORVASC) 10 MG tablet   Other Relevant Orders   Lipid panel     Respiratory   Asthma, moderate persistent    Moderate persistent asthma without exacerbation  Begin Flovent 44 mcg strength 2 puffs daily and use albuterol as needed      Relevant Medications   albuterol (VENTOLIN HFA) 108 (90 Base) MCG/ACT inhaler   fluticasone (FLOVENT HFA) 44 MCG/ACT inhaler     Other   Multiple falls - Primary    History of persistent falls starting in 2016 with episodes of syncope dizziness and bradycardia  Work-up in 2016 unrevealing  I am very concerned about visual field cuts at this office visit  She has multiple injuries on her feet  Plan to refer to neurology and cardiology for further evaluations she may need another brain MRI      Tobacco use    Mild tobacco use    Current smoking consumption amount: 1 cigarette a day  Dicsussion on advise to  quit smoking and smoking impacts: Cardiovascular lung impacts  Patient's willingness to quit: Wants to quit  Methods to quit smoking discussed: Behavioral modification medication management  Medication management of smoking session drugs discussed: Nicotine lozenge  Resources provided:  AVS   Setting quit date 12 weeks  Follow-up  arranged 2 months   Time spent counseling the patient: 5 minutes       Other Visit Diagnoses     Mild intermittent asthma without complication       Relevant Medications   albuterol (VENTOLIN HFA) 108 (90 Base) MCG/ACT inhaler   fluticasone (FLOVENT HFA) 44 MCG/ACT inhaler   Encounter for health-related screening       Relevant Orders   Comprehensive metabolic panel   Hemoglobin A1c   Thyroid Panel With TSH   BMI 30.0-30.9,adult       Relevant Orders   Comprehensive metabolic panel   Hemoglobin A1c   Thyroid Panel With TSH   Lipid panel   Colon cancer screening       Relevant Orders   Fecal occult blood, imunochemical   Syncope, unspecified syncope type       Relevant Orders   Ambulatory referral to Cardiology   Precordial pain       Relevant Orders   Ambulatory referral to Cardiology   Frequent falls       Relevant Orders   Ambulatory referral to Neurology   Decreased peripheral vision of both eyes       Relevant Orders   Ambulatory referral to Neurology   Abnormal brain MRI       Relevant Orders   Ambulatory referral to Neurology   Lipid panel       Outpatient Encounter Medications as of 08/19/2021  Medication Sig   fluticasone (FLOVENT HFA) 44 MCG/ACT inhaler Inhale 2 puffs into the lungs daily.   ibuprofen (ADVIL) 200 MG tablet Take 200 mg by mouth every 6 (six) hours as needed for mild pain or moderate pain.   methocarbamol (ROBAXIN) 500 MG tablet Take 2 tablets (1,000 mg total) by mouth every 8 (eight) hours as needed for muscle spasms.   multivitamin (GERI-TONIC) LIQD Take 15 mLs by mouth once a week.   nicotine polacrilex (NICOTINE MINI) 2 MG lozenge Take 1 lozenge (2 mg total) by mouth as needed for smoking cessation.   valsartan (DIOVAN) 160 MG tablet Take 1 tablet (160 mg total) by mouth daily.   [DISCONTINUED] albuterol (PROVENTIL HFA;VENTOLIN HFA) 108 (90 BASE) MCG/ACT inhaler Inhale 2 puffs into the lungs every 4 (four) hours.   [DISCONTINUED]  amLODipine (NORVASC) 10 MG tablet Take 1 tablet (10 mg total) by mouth daily.   [DISCONTINUED] ibuprofen (ADVIL) 600 MG tablet Take 1 tablet (600 mg total) by mouth every 8 (eight) hours as needed.   albuterol (VENTOLIN HFA) 108 (90 Base) MCG/ACT inhaler Inhale 2 puffs into the lungs every 4 (four) hours.   amLODipine (NORVASC) 10 MG tablet Take 1 tablet (10 mg total) by mouth daily.   [DISCONTINUED] amLODipine (NORVASC) 10 MG tablet Take 1 tablet (10 mg total) by mouth daily.   [DISCONTINUED] Multiple Vitamins-Minerals (MULTI ADULT GUMMIES PO) Take 1 tablet by mouth every other day.    [DISCONTINUED] oxyCODONE-acetaminophen (PERCOCET) 5-325 MG tablet Take 1 tablet by mouth every 6 (six) hours as needed for severe pain.   No facility-administered encounter medications on file as of 08/19/2021.    Follow-up: Return in about 7 weeks (around 10/06/2021).   Shan Levans, MD

## 2021-08-20 ENCOUNTER — Other Ambulatory Visit: Payer: Self-pay

## 2021-08-20 ENCOUNTER — Telehealth: Payer: Self-pay

## 2021-08-20 ENCOUNTER — Other Ambulatory Visit: Payer: Self-pay | Admitting: Critical Care Medicine

## 2021-08-20 DIAGNOSIS — E782 Mixed hyperlipidemia: Secondary | ICD-10-CM | POA: Insufficient documentation

## 2021-08-20 DIAGNOSIS — E876 Hypokalemia: Secondary | ICD-10-CM | POA: Insufficient documentation

## 2021-08-20 LAB — COMPREHENSIVE METABOLIC PANEL
ALT: 12 IU/L (ref 0–32)
AST: 20 IU/L (ref 0–40)
Albumin/Globulin Ratio: 2 (ref 1.2–2.2)
Albumin: 4.7 g/dL (ref 3.8–4.9)
Alkaline Phosphatase: 86 IU/L (ref 44–121)
BUN/Creatinine Ratio: 16 (ref 9–23)
BUN: 14 mg/dL (ref 6–24)
Bilirubin Total: 0.6 mg/dL (ref 0.0–1.2)
CO2: 25 mmol/L (ref 20–29)
Calcium: 9.6 mg/dL (ref 8.7–10.2)
Chloride: 103 mmol/L (ref 96–106)
Creatinine, Ser: 0.87 mg/dL (ref 0.57–1.00)
Globulin, Total: 2.3 g/dL (ref 1.5–4.5)
Glucose: 67 mg/dL (ref 65–99)
Potassium: 3.4 mmol/L — ABNORMAL LOW (ref 3.5–5.2)
Sodium: 140 mmol/L (ref 134–144)
Total Protein: 7 g/dL (ref 6.0–8.5)
eGFR: 80 mL/min/{1.73_m2} (ref >59)

## 2021-08-20 LAB — LIPID PANEL
Chol/HDL Ratio: 2.4 ratio (ref 0.0–4.4)
Cholesterol, Total: 200 mg/dL — ABNORMAL HIGH (ref 100–199)
HDL: 84 mg/dL (ref >39)
LDL Chol Calc (NIH): 105 mg/dL — ABNORMAL HIGH (ref 0–99)
Triglycerides: 61 mg/dL (ref 0–149)
VLDL Cholesterol Cal: 11 mg/dL (ref 5–40)

## 2021-08-20 LAB — HEMOGLOBIN A1C
Est. average glucose Bld gHb Est-mCnc: 105 mg/dL
Hgb A1c MFr Bld: 5.3 % (ref 4.8–5.6)

## 2021-08-20 LAB — THYROID PANEL WITH TSH
Free Thyroxine Index: 1.8 (ref 1.2–4.9)
T3 Uptake Ratio: 26 % (ref 24–39)
T4, Total: 7 ug/dL (ref 4.5–12.0)
TSH: 0.67 u[IU]/mL (ref 0.450–4.500)

## 2021-08-20 MED ORDER — POTASSIUM CHLORIDE ER 10 MEQ PO TBCR
10.0000 meq | EXTENDED_RELEASE_TABLET | Freq: Every day | ORAL | 1 refills | Status: DC
  Filled 2021-08-20: qty 30, 30d supply, fill #0

## 2021-08-20 MED ORDER — ATORVASTATIN CALCIUM 10 MG PO TABS
10.0000 mg | ORAL_TABLET | Freq: Every day | ORAL | 3 refills | Status: DC
  Filled 2021-08-20: qty 30, 30d supply, fill #0

## 2021-08-20 NOTE — Telephone Encounter (Signed)
My Chart

## 2021-08-21 ENCOUNTER — Encounter: Payer: Self-pay | Admitting: Neurology

## 2021-08-27 ENCOUNTER — Other Ambulatory Visit: Payer: Self-pay

## 2021-10-07 ENCOUNTER — Other Ambulatory Visit: Payer: Self-pay

## 2021-10-07 ENCOUNTER — Ambulatory Visit (HOSPITAL_BASED_OUTPATIENT_CLINIC_OR_DEPARTMENT_OTHER): Payer: Self-pay | Admitting: Cardiovascular Disease

## 2021-10-20 ENCOUNTER — Ambulatory Visit: Payer: Self-pay | Admitting: Critical Care Medicine

## 2021-10-20 NOTE — Progress Notes (Incomplete)
Established Patient Office Visit  Subjective:  Patient ID: Marilyn Owen, female    DOB: 03/02/1966  Age: 55 y.o. MRN: [id number]  CC: No chief complaint on file.   HPI Marilyn Owen presents for *** 07/2021 Marilyn Owen presents for primary care to establish.  Previously had been seen in July by Marilyn Owen Documentation from that visit was below Saw  mcclung 06/2021 After ED visit 07/06/2021 for CP and SOB.  Work-up was neg/reassuring..  Breathing better with using inhaler.  BP is improving on increased dose of amlodipine.  She is still having pleuritic CP with movement and with taking a deep breath.  Tylenol is not helping much.  No paresthesias.  No chest pressure.  No radiating pain. Does not feel up to working   From ED note: Patient is a pleasant 55 year old female presenting today with several complaints.  Most immediate complaint is of a squeezing chest pain in the left side of her chest that is pleuritic and worse with movement.  She has mild decreased breath sounds but no other focal complaints.  She has not had a cough or wheezing.  She denies fever but has had diarrhea in the last 24 hours.  She has no focal abdominal tenderness here concerning for diverticulitis appendicitis or other acute abdominal process.  Patient's blood pressure is elevated here most recent number is 174/113.  She did take 5 mg of amlodipine earlier today when she woke up.  She does report that her blood pressure is usually elevated between 1 60-1 70 even with the amlodipine.  Patient's EKG today without acute changes.  Chest x-ray is within normal limits.  CBC and BMP and troponin are all normal.  Patient's chest pain is nonspecific.  Low suspicion for ACS at this time given patient's symptoms have been present for greater than 12 hours and troponin is negative and EKG is unchanged.  It is pleuritic in nature and worsened with certain movements.  Concern for possible asthma exacerbation just without wheezing.  Also  concern for her hypertension but feel this is more chronic.  Also with the diarrhea concern for possible COVID.  D-dimer is negative with a lower suspicion for PE.  Chest x-ray is clear without signs of pneumonia.  We will give a second dose of 5 mg of amlodipine.  Will give patient albuterol to see if that helps with her symptoms.  COVID test is pending.   12:12 AM Labs are reassuring.  Pt's symptoms improved after inhaler.  Bp improved with additional amlodipine.  We will have patient increase amlodipine to 10 mg daily.  No evidence to suggest ACS at this time.  Patient in general is feeling better   Observations/Objective: NAD.  A&Ox3   Assessment and Plan: 1. Pleuritic chest pain - ibuprofen (ADVIL) 600 MG tablet; Take 1 tablet (600 mg total) by mouth every 8 (eight) hours as needed.  Dispense: 60 tablet; Refill: 0   2. Muscle spasm - methocarbamol (ROBAXIN) 500 MG tablet; Take 2 tablets (1,000 mg total) by mouth every 8 (eight) hours as needed for muscle spasms.  Dispense: 90 tablet; Refill: 0   3. Primary hypertension Continue amlodipine 102m daily and continue home BP readings   4. Extrinsic asthma with acute exacerbation, unspecified asthma severity, unspecified whether persistent Inhaler prn   5. Encounter for examination following treatment at [facility] note for this week   Since that visit the patient's chest pain has somewhat resolved she has had mild wheezing  with her albuterol use increased.  She maintains amlodipine 10 mg daily but on arrival today blood pressure still elevated 142/90.  She has episodes where she has gait disturbance and frequent falls.  She is fallen and hurt her foot.  Upon further inquiry she is having visual field cuts peripherally which make it difficult for her to ambulate.  Patient does continue to smoke about 1 cigarette a day.  Patient also had history of snoring and sleep apnea in the past but is not on CPAP therapy.  Patient is due a  tetanus vaccine and she will receive it.  She was screened for HIV and hepatitis C in 2010 was negative.  She had a cath at 55 years of age when she had a chest pain syndrome the catheter study was negative.  She was told she had an acute myocardial infarction at that time.  She is also had spells of syncope in the past with bradycardia.  She had a visit in 2016 with cardiology and had a 24-hour monitor placed with no significant events.  Echocardiogram was unremarkable.  Patient did have an MRI of the brain in 2018 as documented below showed mild white matter changes   Patient works in Public house manager at [employer]         HTN (hypertension)      Hypertension poorly controlled plan to add valsartan 160 mg daily to amlodipine and obtain lipid panel and metabolic panel        Relevant Medications    valsartan (DIOVAN) 160 MG tablet    amLODipine (NORVASC) 10 MG tablet    Other Relevant Orders    Lipid panel        Respiratory    Asthma, moderate persistent      Moderate persistent asthma without exacerbation   Begin Flovent 44 mcg strength 2 puffs daily and use albuterol as needed        Relevant Medications    albuterol (VENTOLIN HFA) 108 (90 Base) MCG/ACT inhaler    fluticasone (FLOVENT HFA) 44 MCG/ACT inhaler        Other    Multiple falls - Primary      History of persistent falls starting in 2016 with episodes of syncope dizziness and bradycardia   Work-up in 2016 unrevealing   I am very concerned about visual field cuts at this office visit   She has multiple injuries on her feet   Plan to refer to neurology and cardiology for further evaluations she may need another brain MRI        Tobacco use      Mild tobacco use       Past Medical History:  Diagnosis Date   Asthma    Financial difficulties    Hypertension    MI (myocardial infarction) ([place])    Migraine headache    Murmur, cardiac     Past Surgical History:  Procedure Laterality Date    APPENDECTOMY     CARDIAC CATHETERIZATION     tubal ligation       Family History  Problem Relation Age of Onset   Hypertension Mother    Heart attack Mother    Heart attack Father     Social History   Socioeconomic History   Marital status: Married    Spouse name: Not on file   Number of children: 4   Years of education: Not on file   Highest education level: Not on file  Occupational History  Not on file  Tobacco Use   Smoking status: Every Day    Types: Cigarettes   Smokeless tobacco: Current  Substance and Sexual Activity   Alcohol use: Yes    Comment: occ   Drug use: Yes    Types: Marijuana   Sexual activity: Not on file  Other Topics Concern   Not on file  Social History Narrative   Not on file   Social Determinants of Health   Financial Resource Strain: Not on file  Food Insecurity: Not on file  Transportation Needs: Not on file  Physical Activity: Not on file  Stress: Not on file  Social Connections: Not on file  Intimate Partner Violence: Not on file    Outpatient Medications Prior to Visit  Medication Sig Dispense Refill   albuterol (VENTOLIN HFA) 108 (90 Base) MCG/ACT inhaler Inhale 2 puffs into the lungs every 4 (four) hours. 18 g 3   amLODipine (NORVASC) 10 MG tablet Take 1 tablet (10 mg total) by mouth daily. 90 tablet 1   atorvastatin (LIPITOR) 10 MG tablet Take 1 tablet (10 mg total) by mouth daily. 60 tablet 3   fluticasone (FLOVENT HFA) 44 MCG/ACT inhaler Inhale 2 puffs into the lungs daily. 10.6 g 12   ibuprofen (ADVIL) 200 MG tablet Take 200 mg by mouth every 6 (six) hours as needed for mild pain or moderate pain.     methocarbamol (ROBAXIN) 500 MG tablet Take 2 tablets (1,000 mg total) by mouth every 8 (eight) hours as needed for muscle spasms. 90 tablet 0   multivitamin (GERI-TONIC) LIQD Take 15 mLs by mouth once a week.     nicotine polacrilex (NICOTINE MINI) 2 MG lozenge Take 1 lozenge (2 mg total) by mouth as  needed for smoking cessation. 100 tablet 0   potassium chloride (KLOR-CON) 10 MEQ tablet Take 1 tablet (10 mEq total) by mouth daily. 30 tablet 1   valsartan (DIOVAN) 160 MG tablet Take 1 tablet (160 mg total) by mouth daily. 60 tablet 3   No facility-administered medications prior to visit.    Allergies  Allergen Reactions   Almond Oil Anaphylaxis   Banana Anaphylaxis   Benadryl [Diphenhydramine Hcl] Anaphylaxis   Other Anaphylaxis and Swelling    Bananas and Almonds.    Penicillins Anaphylaxis    ROS Review of Systems    Objective:    Physical Exam  LMP 09/19/2017 Comment: shielded Wt Readings from Last 3 Encounters:  08/19/21 168 lb 6.4 oz (76.4 kg)  07/06/21 155 lb (70.3 kg)  06/14/21 148 lb (67.1 kg)     Health Maintenance Due  Topic Date Due   PAP SMEAR-Modifier  Never done   Pneumococcal Vaccine 57-61 Years old (2 - PCV) 05/16/2013   COLON CANCER SCREENING ANNUAL FOBT  Never done   MAMMOGRAM  Never done   COVID-19 Vaccine (3 - Booster for Pfizer series) 10/06/2021    There are no preventive care reminders to display for this patient.  Lab Results  Component Value Date   TSH 0.670 08/19/2021   Lab Results  Component Value Date   WBC 6.5 07/06/2021   HGB 14.6 07/06/2021   HCT 45.0 07/06/2021   MCV 94.3 07/06/2021   PLT 204 07/06/2021   Lab Results  Component Value Date   NA 140 08/19/2021   K 3.4 (L) 08/19/2021   CO2 25 08/19/2021   GLUCOSE 67 08/19/2021   BUN 14 08/19/2021   CREATININE 0.87 08/19/2021   BILITOT 0.6 08/19/2021  ALKPHOS 86 08/19/2021   AST 20 08/19/2021   ALT 12 08/19/2021   PROT 7.0 08/19/2021   ALBUMIN 4.7 08/19/2021   CALCIUM 9.6 08/19/2021   ANIONGAP 10 07/06/2021   EGFR 80 08/19/2021   Lab Results  Component Value Date   CHOL 200 (H) 08/19/2021   Lab Results  Component Value Date   HDL 84 08/19/2021   Lab Results  Component Value Date   LDLCALC 105 (H) 08/19/2021   Lab Results  Component  Value Date   TRIG 61 08/19/2021   Lab Results  Component Value Date   CHOLHDL 2.4 08/19/2021   Lab Results  Component Value Date   HGBA1C 5.3 08/19/2021      Assessment & Plan:   Problem List Items Addressed This Visit   None   No orders of the defined types were placed in this encounter.   Follow-up: No follow-ups on file.    Asencion Noble, MD

## 2021-11-16 NOTE — Progress Notes (Deleted)
NEUROLOGY CONSULTATION NOTE  Marilyn Owen MRN: [id number] DOB: Apr 17, 1966  Referring provider: Shan Levans, MD Primary care provider: Shan Levans, MD  Reason for consult:  frequent falls, peripheral vision loss  Assessment/Plan:   ***   Subjective:  Marilyn Owen is a 55 year old female with HTN, asthma, migraines and history of MI who presents for frequent falls and peripheral vision loss.  History supplemented by primary care and ED notes.  ***.  She reports history of migraines.  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  MRI of brain without contrast at that time showed mild small vessel ischemic changes in the cerebral white matter as well as left frontal developmental venous anomaly without edema or mass effect.  She has prior history of syncope that was worked up by cardiology in 2016 when echocardiogram and 24 hour Holter monitor were unremarkable.       PAST MEDICAL HISTORY: Past Medical History:  Diagnosis Date   Asthma    Financial difficulties    Hypertension    MI (myocardial infarction) (HCC)    Migraine headache    Murmur, cardiac     PAST SURGICAL HISTORY: Past Surgical History:  Procedure Laterality Date   APPENDECTOMY     CARDIAC CATHETERIZATION     tubal ligation       MEDICATIONS: Current Outpatient Medications on File Prior to Visit  Medication Sig Dispense Refill   albuterol (VENTOLIN HFA) 108 (90 Base) MCG/ACT inhaler Inhale 2 puffs into the lungs every 4 (four) hours. 18 g 3   amLODipine (NORVASC) 10 MG tablet Take 1 tablet (10 mg total) by mouth daily. 90 tablet 1   atorvastatin (LIPITOR) 10 MG tablet Take 1 tablet (10 mg total) by mouth daily. 60 tablet 3   fluticasone (FLOVENT HFA) 44 MCG/ACT inhaler Inhale 2 puffs into the lungs daily. 10.6 g 12   ibuprofen (ADVIL) 200 MG tablet Take 200 mg  by mouth every 6 (six) hours as needed for mild pain or moderate pain.     methocarbamol (ROBAXIN) 500 MG tablet Take 2 tablets (1,000 mg total) by mouth every 8 (eight) hours as needed for muscle spasms. 90 tablet 0   multivitamin (GERI-TONIC) LIQD Take 15 mLs by mouth once a week.     nicotine polacrilex (NICOTINE MINI) 2 MG lozenge Take 1 lozenge (2 mg total) by mouth as needed for smoking cessation. 100 tablet 0   potassium chloride (KLOR-CON) 10 MEQ tablet Take 1 tablet (10 mEq total) by mouth daily. 30 tablet 1   valsartan (DIOVAN) 160 MG tablet Take 1 tablet (160 mg total) by mouth daily. 60 tablet 3   No current facility-administered medications on file prior to visit.    ALLERGIES: Allergies  Allergen Reactions   Almond Oil Anaphylaxis   Banana Anaphylaxis   Benadryl [Diphenhydramine Hcl] Anaphylaxis   Other Anaphylaxis and Swelling    Bananas and Almonds.    Penicillins Anaphylaxis    FAMILY HISTORY: Family History  Problem Relation Age of Onset   Hypertension Mother    Heart attack Mother    Heart attack Father     Objective:  *** General: No acute distress.  Patient appears well-groomed.   Head:  Normocephalic/atraumatic Eyes:  fundi examined but not visualized Neck: supple, no paraspinal tenderness, full range of motion Back: No paraspinal tenderness Heart:  regular rate and rhythm Lungs: Clear to auscultation bilaterally. Vascular: No carotid bruits. Neurological Exam: Mental status: alert and oriented to person, place, and time, recent and remote memory intact, fund of knowledge intact, attention and concentration intact, speech fluent and not dysarthric, language intact. Cranial nerves: CN I: not tested CN II: pupils equal, round and reactive to light, visual fields intact CN III, IV, VI:  full range of motion, no nystagmus, no ptosis CN V: facial sensation intact. CN VII: upper and lower face symmetric CN VIII: hearing intact CN IX, X: gag intact, uvula  midline CN XI: sternocleidomastoid and trapezius muscles intact CN XII: tongue midline Bulk & Tone: normal, no fasciculations. Motor:  muscle strength 5/5 throughout Sensation:  Pinprick, temperature and vibratory sensation intact. Deep Tendon Reflexes:  2+ throughout,  toes downgoing.   Finger to nose testing:  Without dysmetria.   Heel to shin:  Without dysmetria.   Gait:  Normal station and stride.  Romberg negative.    Thank you for allowing me to take part in the care of this patient.  Shon Millet, DO  CC: ***

## 2021-11-17 ENCOUNTER — Ambulatory Visit: Payer: Self-pay | Admitting: Neurology

## 2021-12-10 IMAGING — DX DG TOE GREAT 2+V*L*
1 series · 3 of 3 positions shown · non-contrast
Comparison: None.

CLINICAL DATA: Initial evaluation for acute crush injury.  Pain.

EXAM:
LEFT GREAT TOE

[Series 1: toe · 0.14mm/px · 3 of 3 slices shown]
[im 1/3]
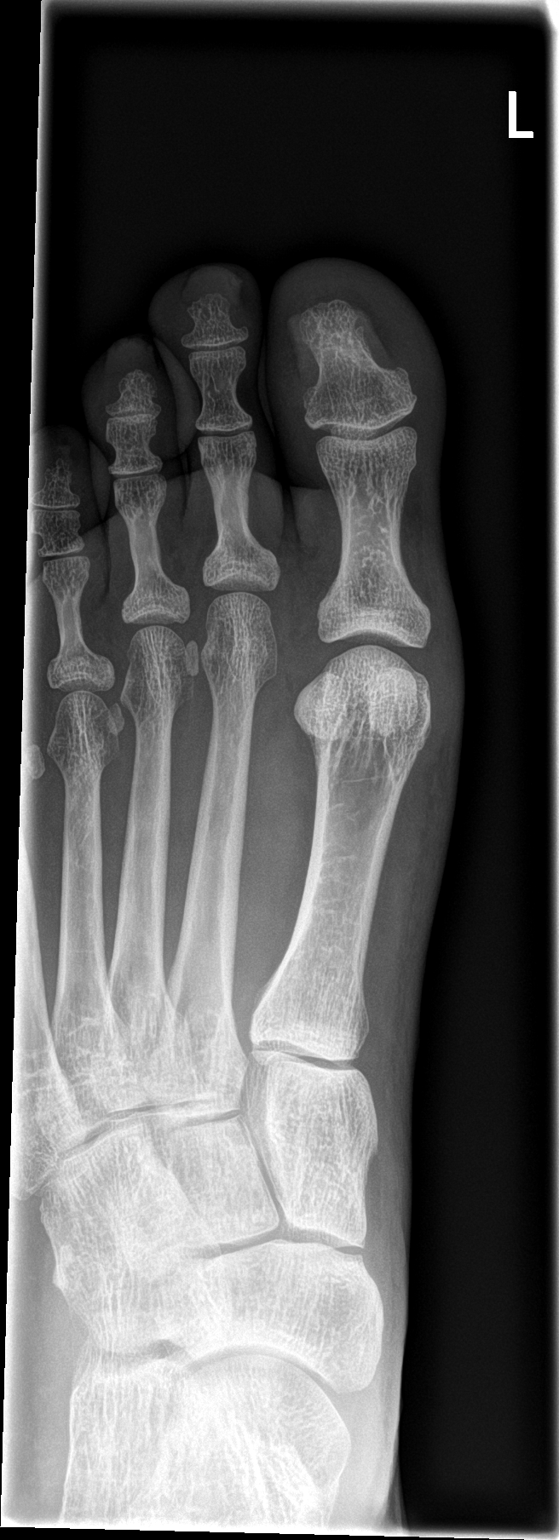
[im 2/3]
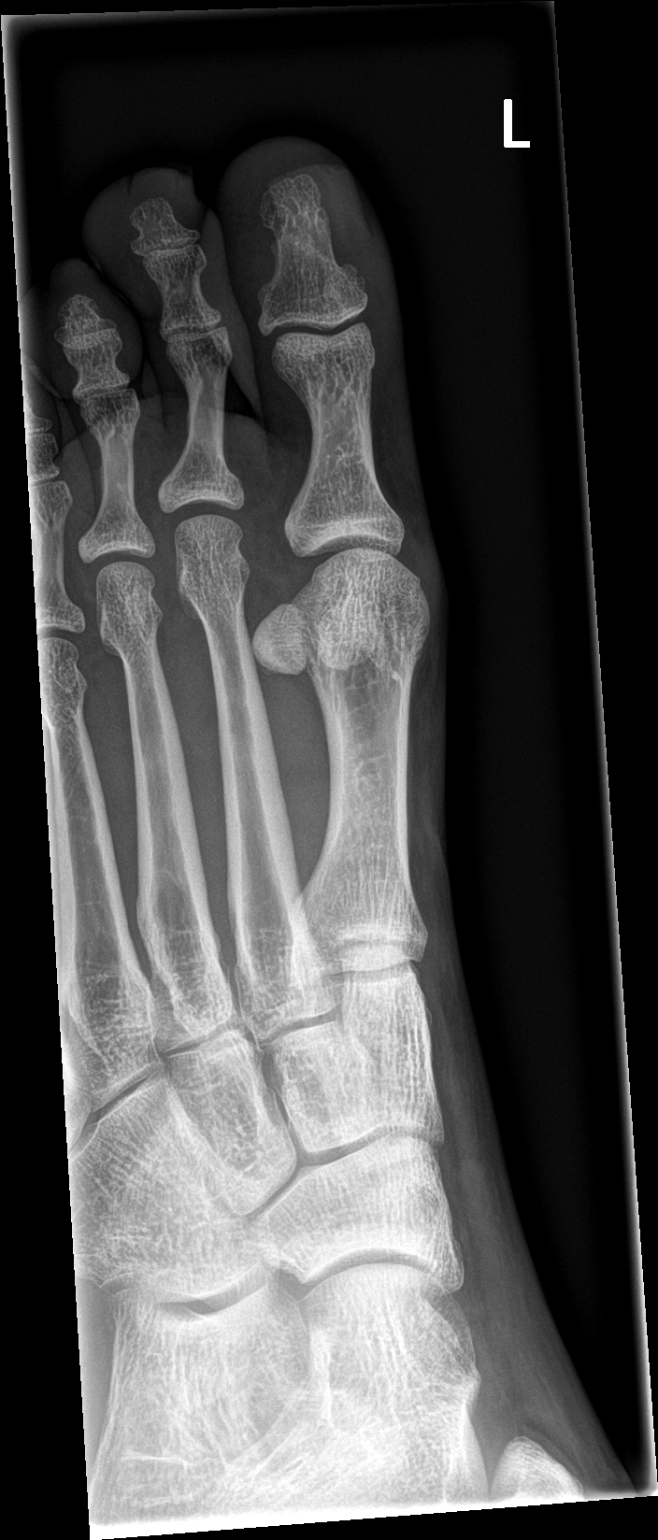
[im 3/3]
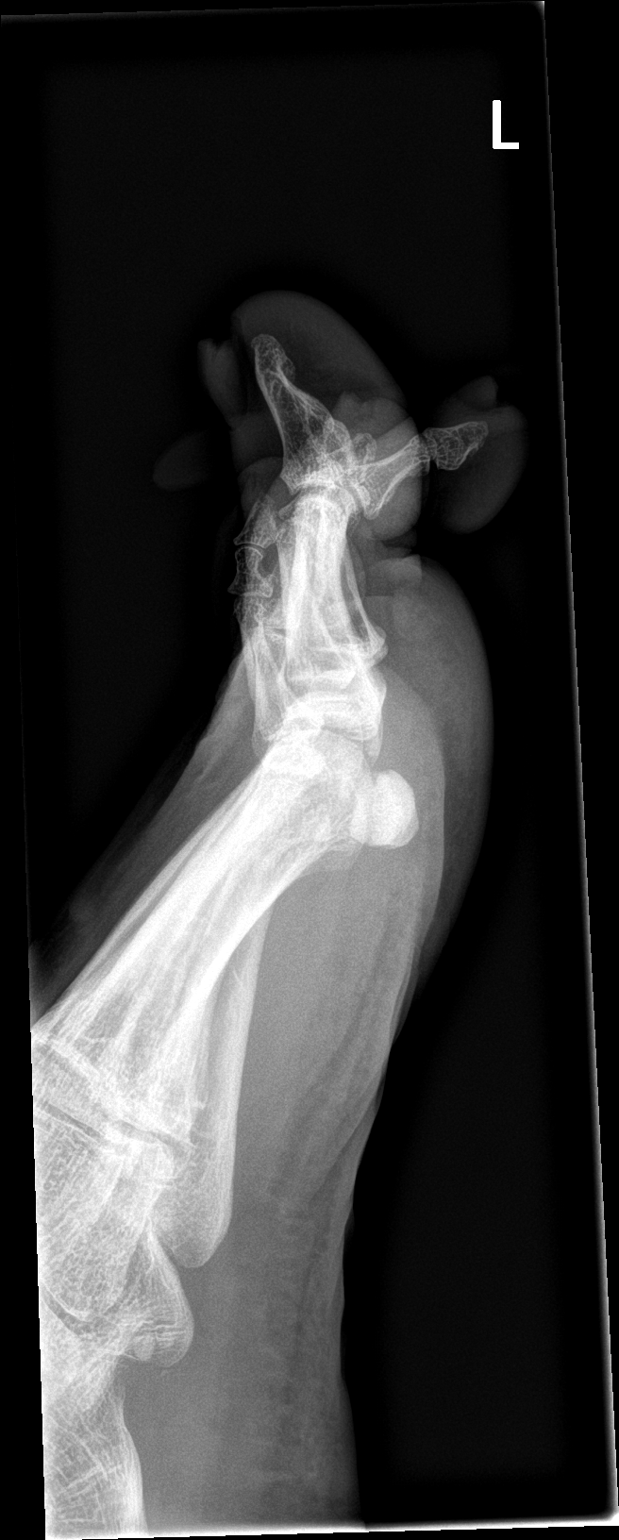

[3 of 3 positions shown; findings below may reference images not displayed]

FINDINGS: There is no evidence of fracture or dislocation. There is no
evidence of arthropathy or other focal bone abnormality. Soft
tissues are unremarkable.
IMPRESSION: Negative.

## 2022-01-01 IMAGING — DX DG CHEST 2V
2 series · 2 of 2 positions shown · non-contrast
Comparison: PA and lateral chest 09/29/2017.

CLINICAL DATA: Chest pain and shortness of breath.

EXAM:
CHEST - 2 VIEW

[chest pa]
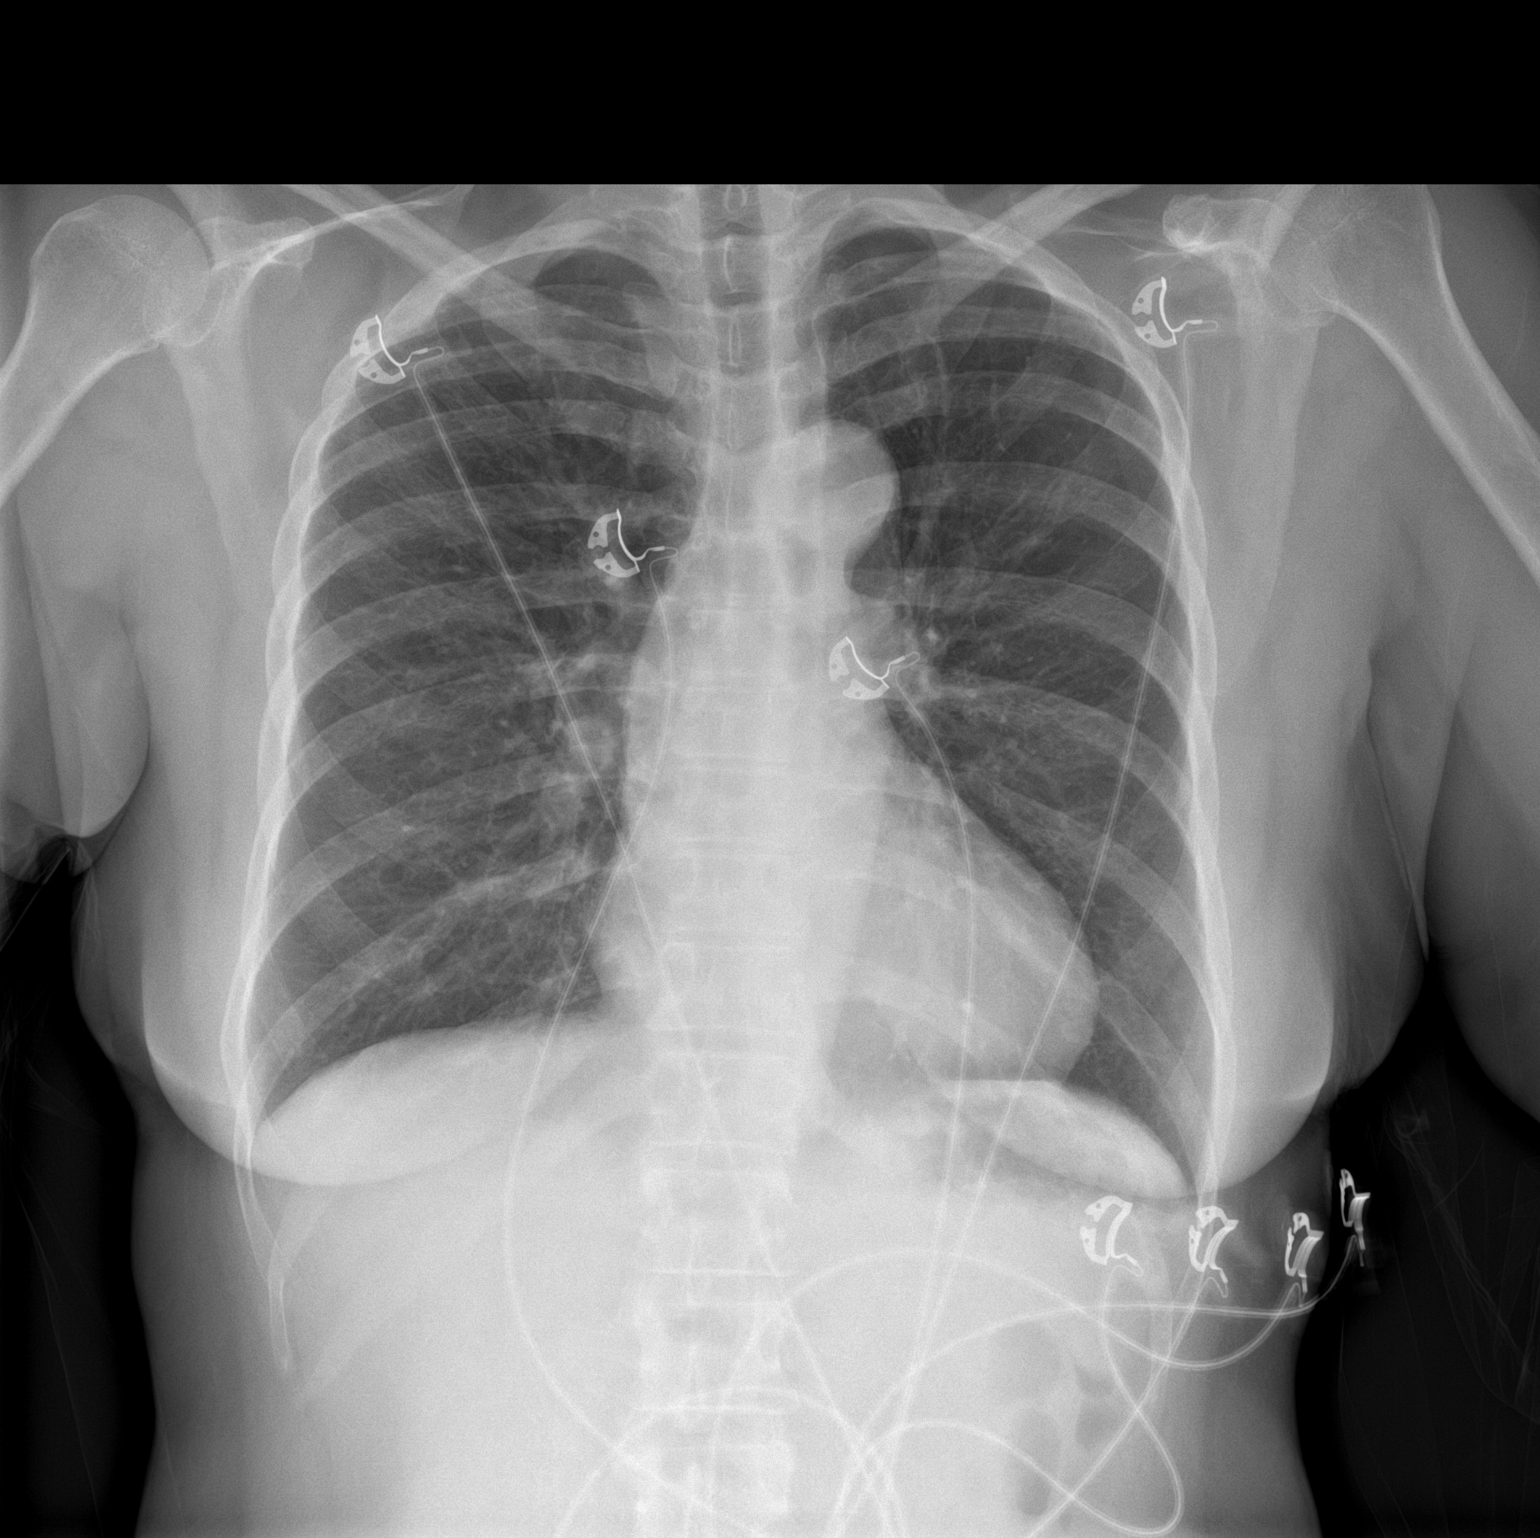

[chest lat]
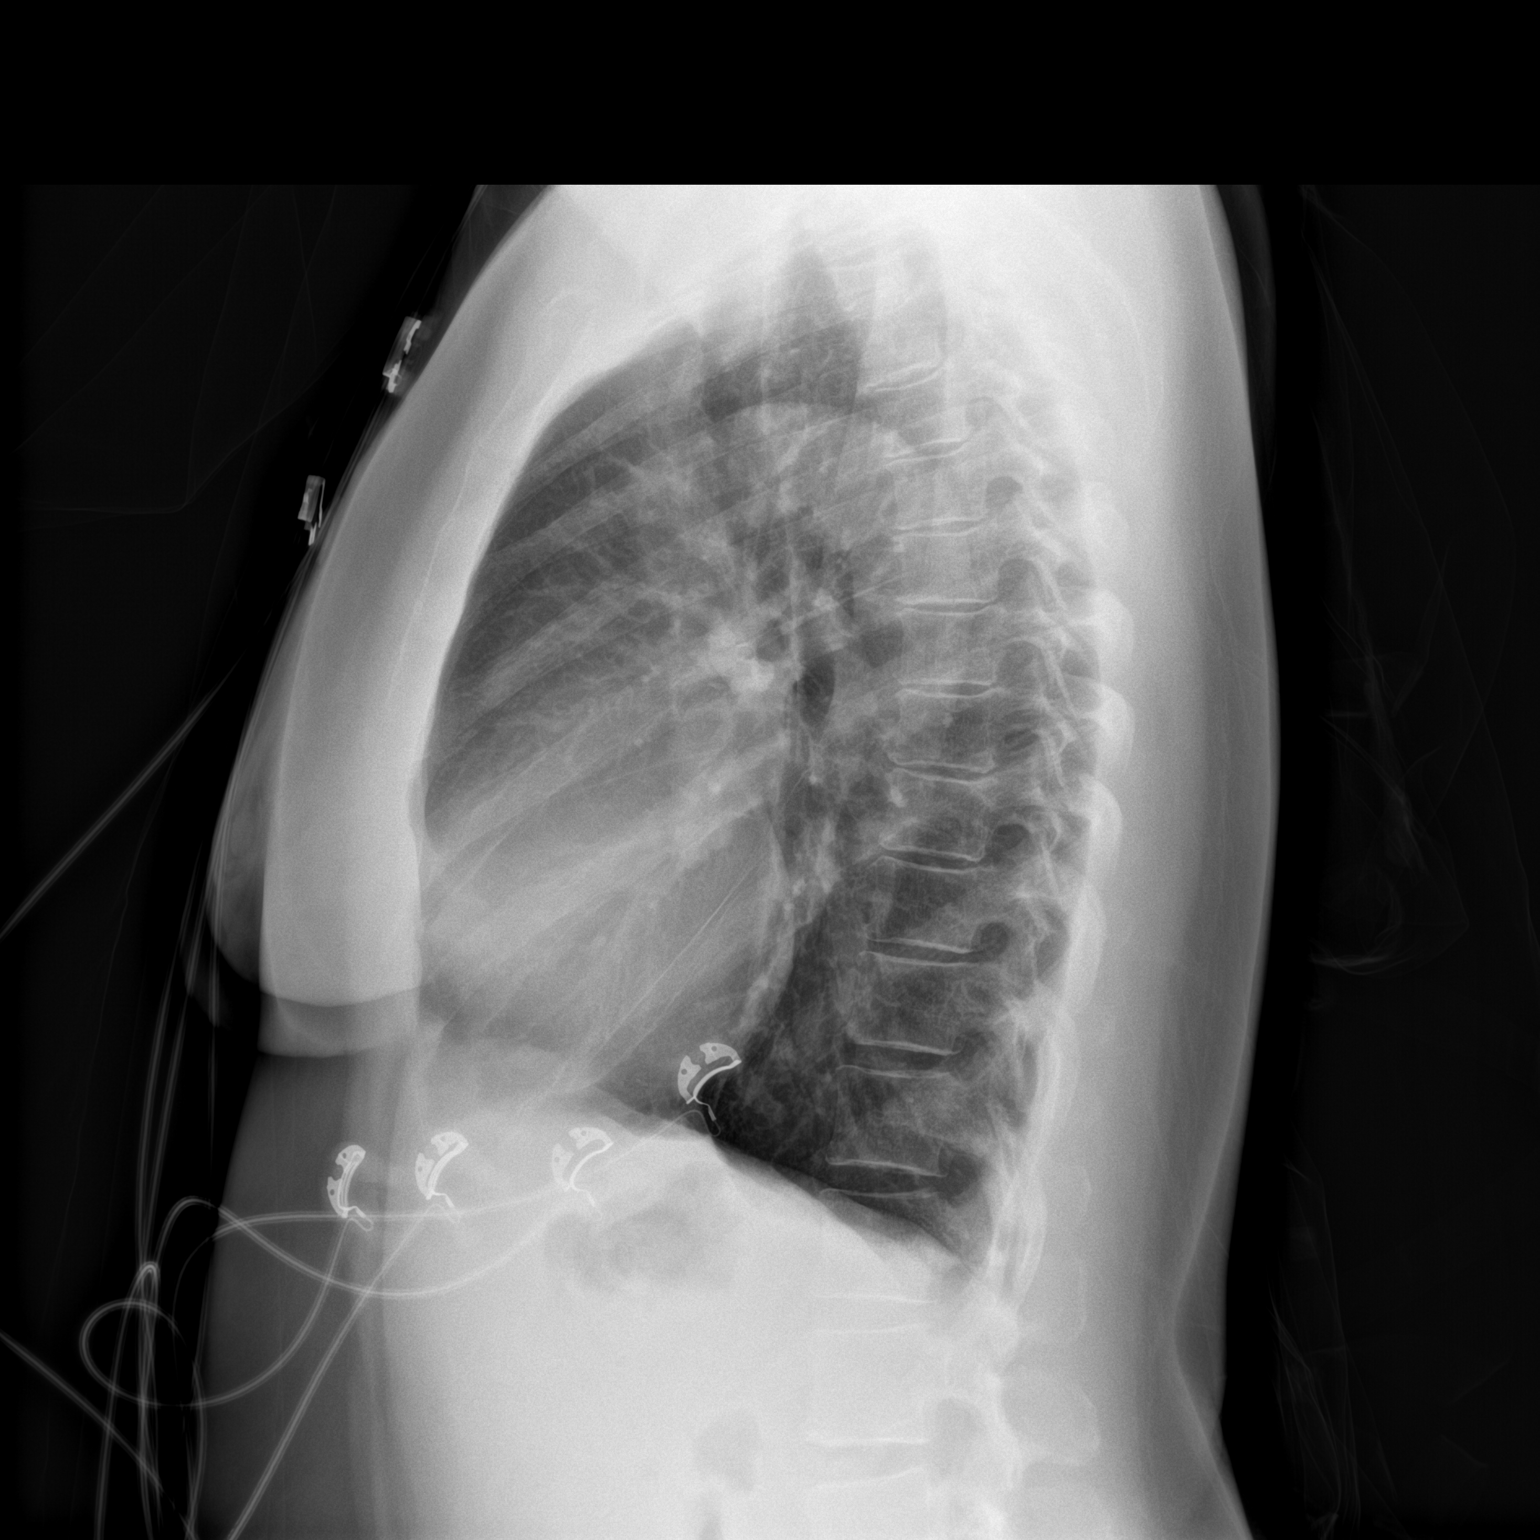

[2 of 2 positions shown; findings below may reference images not displayed]

FINDINGS: Lungs clear. Heart size normal. No pneumothorax or pleural fluid. No
acute or focal bony abnormality.
IMPRESSION: Negative chest.

## 2023-03-02 ENCOUNTER — Emergency Department (HOSPITAL_COMMUNITY): Payer: Self-pay

## 2023-03-02 ENCOUNTER — Other Ambulatory Visit: Payer: Self-pay

## 2023-03-02 ENCOUNTER — Inpatient Hospital Stay (HOSPITAL_COMMUNITY)
Admission: EM | Admit: 2023-03-02 | Discharge: 2023-03-03 | DRG: 313 | Disposition: A | Discharge status: Home / Self Care | Payer: Self-pay | Attending: Internal Medicine | Admitting: Internal Medicine

## 2023-03-02 ENCOUNTER — Encounter (HOSPITAL_COMMUNITY): Payer: Self-pay

## 2023-03-02 DIAGNOSIS — G43909 Migraine, unspecified, not intractable, without status migrainosus: Secondary | ICD-10-CM | POA: Diagnosis present

## 2023-03-02 DIAGNOSIS — F17210 Nicotine dependence, cigarettes, uncomplicated: Secondary | ICD-10-CM | POA: Diagnosis present

## 2023-03-02 DIAGNOSIS — Z91119 Patient's noncompliance with dietary regimen due to unspecified reason: Secondary | ICD-10-CM

## 2023-03-02 DIAGNOSIS — J4520 Mild intermittent asthma, uncomplicated: Secondary | ICD-10-CM | POA: Diagnosis present

## 2023-03-02 DIAGNOSIS — I252 Old myocardial infarction: Secondary | ICD-10-CM

## 2023-03-02 DIAGNOSIS — Z880 Allergy status to penicillin: Secondary | ICD-10-CM

## 2023-03-02 DIAGNOSIS — F17220 Nicotine dependence, chewing tobacco, uncomplicated: Secondary | ICD-10-CM | POA: Diagnosis present

## 2023-03-02 DIAGNOSIS — R011 Cardiac murmur, unspecified: Secondary | ICD-10-CM | POA: Diagnosis present

## 2023-03-02 DIAGNOSIS — Z91018 Allergy to other foods: Secondary | ICD-10-CM

## 2023-03-02 DIAGNOSIS — R072 Precordial pain: Principal | ICD-10-CM | POA: Diagnosis present

## 2023-03-02 DIAGNOSIS — R296 Repeated falls: Secondary | ICD-10-CM | POA: Diagnosis present

## 2023-03-02 DIAGNOSIS — I10 Essential (primary) hypertension: Secondary | ICD-10-CM | POA: Diagnosis present

## 2023-03-02 DIAGNOSIS — Z8249 Family history of ischemic heart disease and other diseases of the circulatory system: Secondary | ICD-10-CM

## 2023-03-02 DIAGNOSIS — I160 Hypertensive urgency: Secondary | ICD-10-CM | POA: Diagnosis present

## 2023-03-02 DIAGNOSIS — Z79899 Other long term (current) drug therapy: Secondary | ICD-10-CM

## 2023-03-02 DIAGNOSIS — I2510 Atherosclerotic heart disease of native coronary artery without angina pectoris: Secondary | ICD-10-CM | POA: Diagnosis present

## 2023-03-02 DIAGNOSIS — R079 Chest pain, unspecified: Secondary | ICD-10-CM | POA: Diagnosis present

## 2023-03-02 DIAGNOSIS — Z888 Allergy status to other drugs, medicaments and biological substances status: Secondary | ICD-10-CM

## 2023-03-02 DIAGNOSIS — E782 Mixed hyperlipidemia: Secondary | ICD-10-CM | POA: Diagnosis present

## 2023-03-02 DIAGNOSIS — M7918 Myalgia, other site: Secondary | ICD-10-CM | POA: Diagnosis present

## 2023-03-02 LAB — CBC WITH DIFFERENTIAL/PLATELET
Abs Immature Granulocytes: 0.01 10*3/uL (ref 0.00–0.07)
Basophils Absolute: 0.1 10*3/uL (ref 0.0–0.1)
Basophils Relative: 1 %
Eosinophils Absolute: 0.1 10*3/uL (ref 0.0–0.5)
Eosinophils Relative: 1 %
HCT: 44.9 % (ref 36.0–46.0)
Hemoglobin: 15 g/dL (ref 12.0–15.0)
Immature Granulocytes: 0 %
Lymphocytes Relative: 44 %
Lymphs Abs: 2.9 10*3/uL (ref 0.7–4.0)
MCH: 31.3 pg (ref 26.0–34.0)
MCHC: 33.4 g/dL (ref 30.0–36.0)
MCV: 93.7 fL (ref 80.0–100.0)
Monocytes Absolute: 0.4 10*3/uL (ref 0.1–1.0)
Monocytes Relative: 6 %
Neutro Abs: 3.1 10*3/uL (ref 1.7–7.7)
Neutrophils Relative %: 48 %
Platelets: 232 10*3/uL (ref 150–400)
RBC: 4.79 MIL/uL (ref 3.87–5.11)
RDW: 13 % (ref 11.5–15.5)
WBC: 6.5 10*3/uL (ref 4.0–10.5)
nRBC: 0 % (ref 0.0–0.2)

## 2023-03-02 LAB — COMPREHENSIVE METABOLIC PANEL
ALT: 12 U/L (ref 0–44)
AST: 23 U/L (ref 15–41)
Albumin: 3.9 g/dL (ref 3.5–5.0)
Alkaline Phosphatase: 82 U/L (ref 38–126)
Anion gap: 9 (ref 5–15)
BUN: 10 mg/dL (ref 6–20)
CO2: 25 mmol/L (ref 22–32)
Calcium: 9.5 mg/dL (ref 8.9–10.3)
Chloride: 104 mmol/L (ref 98–111)
Creatinine, Ser: 0.76 mg/dL (ref 0.44–1.00)
GFR, Estimated: >60 mL/min (ref >60)
Glucose, Bld: 82 mg/dL (ref 70–99)
Potassium: 3.4 mmol/L — ABNORMAL LOW (ref 3.5–5.1)
Sodium: 138 mmol/L (ref 135–145)
Total Bilirubin: 0.4 mg/dL (ref 0.3–1.2)
Total Protein: 7.4 g/dL (ref 6.5–8.1)

## 2023-03-02 LAB — TROPONIN I (HIGH SENSITIVITY): Troponin I (High Sensitivity): 3 ng/L (ref <18)

## 2023-03-02 MED ORDER — ACETAMINOPHEN 500 MG PO TABS
1000.0000 mg | ORAL_TABLET | Freq: Once | ORAL | Status: AC
  Administered 2023-03-03: 1000 mg via ORAL
  Filled 2023-03-02: qty 2

## 2023-03-02 MED ORDER — NITROGLYCERIN 2 % TD OINT
1.0000 [in_us] | TOPICAL_OINTMENT | Freq: Once | TRANSDERMAL | Status: AC
  Administered 2023-03-03: 1 [in_us] via TOPICAL
  Filled 2023-03-02: qty 1

## 2023-03-02 MED ORDER — PROCHLORPERAZINE EDISYLATE 10 MG/2ML IJ SOLN
10.0000 mg | Freq: Once | INTRAMUSCULAR | Status: AC
  Administered 2023-03-03: 10 mg via INTRAVENOUS
  Filled 2023-03-02: qty 2

## 2023-03-02 NOTE — ED Triage Notes (Signed)
Patient presents to ER with headache, chest pain back pain and HTN.  Reports it started last night and she has had nausea. Hx of HTN on amlodipine.  No neuro focal deficits but does complains of tingling to all fingers and toes.

## 2023-03-02 NOTE — ED Provider Triage Note (Signed)
Emergency Medicine Provider Triage Evaluation Note  Marilyn Owen , a 57 y.o. female  was evaluated in triage.  Pt complains of headache generalized which began yesterday reports this feels like a "nitroglycerin headache ".  She also endorses not following up for her blood pressure in about 2 years.  She has also some chest pain describing as a squeezing sensation which began yesterday.  She is only on amlodipine 10 mg for blood pressure regimen.e  Review of Systems  Positive: Headache, chest pain Negative: Sob, vision changes  Physical Exam  BP (!) 188/128 (BP Location: Right Arm)   Pulse 98   Temp 98.5 F (36.9 C) (Oral)   Resp 16   Ht '5\' 2"'$  (1.575 m)   Wt 74.8 kg   LMP 09/19/2017 Comment: shielded  SpO2 100%   BMI 30.18 kg/m  Gen:   Awake, no distress   Resp:  Normal effort  MSK:   Moves extremities without difficulty  Other:    Medical Decision Making  Medically screening exam initiated at 12:51 PM.  Appropriate orders placed.  Marilyn Owen was informed that the remainder of the evaluation will be completed by another provider, this initial triage assessment does not replace that evaluation, and the importance of remaining in the ED until their evaluation is complete.     Janeece Fitting, PA-C 03/02/23 1259

## 2023-03-02 NOTE — ED Provider Notes (Incomplete)
[facility] Provider Note  CSN: [id number] Arrival date & time: 03/02/23 1238  Chief Complaint(s) Chest Pain and Headache  HPI Marilyn Owen is a 57 y.o. female {Add pertinent medical, surgical, social history, OB history to HPI:1}    Chest Pain Associated symptoms: headache   Headache   Past Medical History Past Medical History:  Diagnosis Date   Asthma    Financial difficulties    Hypertension    MI (myocardial infarction) ([place])    Migraine headache    Murmur, cardiac    Patient Active Problem List   Diagnosis Date Noted   Mixed hyperlipidemia 08/20/2021   Hypokalemia 08/20/2021   Tobacco use 08/19/2021   Degeneration of lumbar intervertebral disc 10/16/2019   Bradycardia 07/26/2015   Dizziness 07/26/2015   Multiple falls 07/26/2015   Asthma, moderate persistent 05/11/2012   HTN (hypertension) 09/22/2010   Home Medication(s) Prior to Admission medications   Medication Sig Start Date End Date Taking? Authorizing Provider  albuterol (VENTOLIN HFA) 108 (90 Base) MCG/ACT inhaler Inhale 2 puffs into the lungs every 4 (four) hours. 08/19/21   Elsie Stain, MD  amLODipine (NORVASC) 10 MG tablet Take 1 tablet (10 mg total) by mouth daily. 08/19/21   Elsie Stain, MD  atorvastatin (LIPITOR) 10 MG tablet Take 1 tablet (10 mg total) by mouth daily. 08/20/21   Elsie Stain, MD  fluticasone (FLOVENT HFA) 44 MCG/ACT inhaler Inhale 2 puffs into the lungs daily. 08/19/21   Elsie Stain, MD  ibuprofen (ADVIL) 200 MG tablet Take 200 mg by mouth every 6 (six) hours as needed for mild pain or moderate pain.    [provider]  methocarbamol (ROBAXIN) 500 MG tablet Take 2 tablets (1,000 mg total) by mouth every 8 (eight) hours as needed for muscle spasms. 07/08/21   Argentina Donovan, PA-C  multivitamin (GERI-TONIC) LIQD Take 15 mLs by mouth once a week.    [provider]  nicotine polacrilex (NICOTINE MINI) 2  MG lozenge Take 1 lozenge (2 mg total) by mouth as needed for smoking cessation. 08/19/21   Elsie Stain, MD  potassium chloride (KLOR-CON) 10 MEQ tablet Take 1 tablet (10 mEq total) by mouth daily. 08/20/21   Elsie Stain, MD  valsartan (DIOVAN) 160 MG tablet Take 1 tablet (160 mg total) by mouth daily. 08/19/21   Elsie Stain, MD                                                                                                                                    Allergies Almond oil, Banana, Benadryl [diphenhydramine hcl], Other, and Penicillins  Review of Systems Review of Systems  Cardiovascular:  Positive for chest pain.  Neurological:  Positive for headaches.   As noted in HPI  Physical Exam Vital Signs  I have reviewed the triage vital signs BP (!) 163/100 (BP Location:  Right Arm)   Pulse 75   Temp 98.7 F (37.1 C) (Oral)   Resp 18   Ht '5\' 2"'$  (1.575 m)   Wt 74.8 kg   LMP 09/19/2017 Comment: shielded  SpO2 95%   BMI 30.18 kg/m  *** Physical Exam  ED Results and Treatments Labs (all labs ordered are listed, but only abnormal results are displayed) Labs Reviewed  COMPREHENSIVE METABOLIC PANEL - Abnormal; Notable for the following components:      Result Value   Potassium 3.4 (*)    All other components within normal limits  CBC WITH DIFFERENTIAL/PLATELET  TROPONIN I (HIGH SENSITIVITY)  TROPONIN I (HIGH SENSITIVITY)                                                                                                                         EKG  EKG Interpretation  Date/Time:  Wednesday March 02 2023 22:44:30 EDT Ventricular Rate:  67 PR Interval:  146 QRS Duration: 76 QT Interval:  442 QTC Calculation: 467 R Axis:   84 Text Interpretation: Sinus rhythm Borderline T abnormalities, anterior leads Confirmed by Addison Lank [phone]) on 03/02/2023 11:07:00 PM       Radiology DG Chest 2 View  Result Date: 03/02/2023 CLINICAL DATA:  Chest pain. EXAM: CHEST -  2 VIEW COMPARISON:  Chest x-ray 07/06/2021. FINDINGS: The heart size and mediastinal contours are within normal limits. Both lungs are clear. No visible pleural effusions or pneumothorax. No acute osseous abnormality. IMPRESSION: No active cardiopulmonary disease. Electronically Signed   By: Margaretha Sheffield M.D.   On: 03/02/2023 13:21    Medications Ordered in ED Medications - No data to display                                                                                                                                   Procedures Procedures  (including critical care time)  Medical Decision Making / ED Course  Click here for ABCD2, HEART and other calculators  Medical Decision Making         Final Clinical Impression(s) / ED Diagnoses Final diagnoses:  None    {Document critical care time when appropriate:1}  {Document review of labs and clinical decision tools ie heart score, Chads2Vasc2 etc:1}  {Document your independent review of radiology images, and any outside records:1} {Document your discussion with family members, caretakers, and with consultants:1} {Document social determinants of health  affecting pt's care:1} {Document your decision making why or why not admission, treatments were needed:1} This chart was dictated using voice recognition software.  Despite best efforts to proofread,  errors can occur which can change the documentation meaning.

## 2023-03-03 ENCOUNTER — Emergency Department (HOSPITAL_COMMUNITY): Payer: Self-pay

## 2023-03-03 ENCOUNTER — Inpatient Hospital Stay (HOSPITAL_COMMUNITY): Payer: Self-pay

## 2023-03-03 DIAGNOSIS — I10 Essential (primary) hypertension: Secondary | ICD-10-CM

## 2023-03-03 DIAGNOSIS — R0789 Other chest pain: Secondary | ICD-10-CM

## 2023-03-03 DIAGNOSIS — R079 Chest pain, unspecified: Secondary | ICD-10-CM

## 2023-03-03 LAB — HIV ANTIBODY (ROUTINE TESTING W REFLEX): HIV Screen 4th Generation wRfx: NONREACTIVE

## 2023-03-03 LAB — ECHOCARDIOGRAM COMPLETE
AR max vel: 2.48 cm2
AV Area VTI: 2.2 cm2
AV Area mean vel: 2.54 cm2
AV Mean grad: 9 mmHg
AV Peak grad: 17.5 mmHg
Ao pk vel: 2.09 m/s
Area-P 1/2: 2.83 cm2
Height: 62 in
MV VTI: 2.4 cm2
S' Lateral: 2 cm
Weight: 2640 oz

## 2023-03-03 LAB — CBC
HCT: 43.7 % (ref 36.0–46.0)
Hemoglobin: 14.5 g/dL (ref 12.0–15.0)
MCH: 31 pg (ref 26.0–34.0)
MCHC: 33.2 g/dL (ref 30.0–36.0)
MCV: 93.6 fL (ref 80.0–100.0)
Platelets: 217 10*3/uL (ref 150–400)
RBC: 4.67 MIL/uL (ref 3.87–5.11)
RDW: 13.1 % (ref 11.5–15.5)
WBC: 5.4 10*3/uL (ref 4.0–10.5)
nRBC: 0 % (ref 0.0–0.2)

## 2023-03-03 LAB — CREATININE, SERUM
Creatinine, Ser: 0.77 mg/dL (ref 0.44–1.00)
GFR, Estimated: >60 mL/min (ref >60)

## 2023-03-03 LAB — TROPONIN I (HIGH SENSITIVITY)
Troponin I (High Sensitivity): 4 ng/L (ref <18)
Troponin I (High Sensitivity): 5 ng/L (ref <18)

## 2023-03-03 LAB — D-DIMER, QUANTITATIVE: D-Dimer, Quant: 0.77 ug/mL-FEU — ABNORMAL HIGH (ref 0.00–0.50)

## 2023-03-03 MED ORDER — ONDANSETRON HCL 4 MG/2ML IJ SOLN
4.0000 mg | Freq: Four times a day (QID) | INTRAMUSCULAR | Status: DC | PRN
  Administered 2023-03-03: 4 mg via INTRAVENOUS
  Filled 2023-03-03: qty 2

## 2023-03-03 MED ORDER — MORPHINE SULFATE (PF) 2 MG/ML IV SOLN: 2.0000 mg | INTRAVENOUS | Status: DC | PRN

## 2023-03-03 MED ORDER — ASPIRIN 325 MG PO TBEC
325.0000 mg | DELAYED_RELEASE_TABLET | Freq: Once | ORAL | Status: AC
  Administered 2023-03-03: 325 mg via ORAL
  Filled 2023-03-03: qty 1

## 2023-03-03 MED ORDER — IOHEXOL 350 MG/ML SOLN
50.0000 mL | Freq: Once | INTRAVENOUS | Status: AC | PRN
  Administered 2023-03-03: 50 mL via INTRAVENOUS

## 2023-03-03 MED ORDER — AMLODIPINE BESYLATE 5 MG PO TABS
10.0000 mg | ORAL_TABLET | Freq: Every day | ORAL | Status: DC
  Administered 2023-03-03: 10 mg via ORAL
  Filled 2023-03-03: qty 2

## 2023-03-03 MED ORDER — ALUM & MAG HYDROXIDE-SIMETH 200-200-20 MG/5ML PO SUSP
30.0000 mL | Freq: Once | ORAL | Status: AC
  Administered 2023-03-03: 30 mL via ORAL
  Filled 2023-03-03: qty 30

## 2023-03-03 MED ORDER — HYDRALAZINE HCL 20 MG/ML IJ SOLN
10.0000 mg | Freq: Four times a day (QID) | INTRAMUSCULAR | Status: DC | PRN
  Administered 2023-03-03: 10 mg via INTRAVENOUS
  Filled 2023-03-03: qty 1

## 2023-03-03 MED ORDER — HEPARIN SODIUM (PORCINE) 5000 UNIT/ML IJ SOLN
5000.0000 [IU] | Freq: Three times a day (TID) | INTRAMUSCULAR | Status: DC
  Administered 2023-03-03: 5000 [IU] via SUBCUTANEOUS
  Filled 2023-03-03: qty 1

## 2023-03-03 MED ORDER — SODIUM CHLORIDE 0.45 % IV SOLN: INTRAVENOUS | Status: DC

## 2023-03-03 MED ORDER — ACETAMINOPHEN 325 MG PO TABS: 650.0000 mg | ORAL_TABLET | ORAL | Status: DC | PRN

## 2023-03-03 MED ORDER — ASPIRIN 81 MG PO TBEC: 81.0000 mg | DELAYED_RELEASE_TABLET | Freq: Every day | ORAL | Status: DC

## 2023-03-03 NOTE — Discharge Summary (Signed)
Physician Discharge Summary  Marilyn Owen [id number] DOB: 07-23-1966 DOA: 03/02/2023  PCP: Elsie Stain, MD  Admit date: 03/02/2023 Discharge date: 03/03/2023  Admitted From: Home Disposition: Home  Recommendations for Outpatient Follow-up:  Follow up with PCP in 1-2 weeks Will send referral to cardiology for follow-up  Discharge Condition: Stable CODE STATUS: Full code Diet recommendation: Low-salt diet  Discharge summary: 57 year old with history of mild intermittent asthma, essential hypertension, history of nonobstructive coronary artery disease, migraine presented to the emergency room with 1 week of on and off left posterior scapular pain, back pain.  She was following up with chiropractor for her back pain, blood pressure was found to be 200 so referred to ER.  In the emergency room hemodynamically stable.  EKG with very nonspecific changes.  Atypical chest pain, acute coronary syndrome ruled out. EKG and follow-up EKG nonischemic. Troponins and follow-up troponins are nonischemic. Echocardiogram with normal ejection fraction.  No regional wall motion abnormality. CT angiogram of the chest negative for pulmonary embolism or dissection. Likely musculoskeletal pain.  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  Advised conservative management, back exercises. Blood pressure is elevated probably due to stress, currently stable after resuming amlodipine.  No indication to add more blood pressure medications at this time. She will benefit with follow-up at cardiology clinic, will send referral. Stable for discharge.    Discharge Diagnoses:  Principal Problem:   Chest pain    Discharge Instructions  Discharge Instructions     Ambulatory referral to Cardiology   Complete by: As directed    Call MD for:  difficulty breathing, headache or visual disturbances   Complete by: As directed    Call MD for:  severe  uncontrolled pain   Complete by: As directed    Diet - low sodium heart healthy   Complete by: As directed    Increase activity slowly   Complete by: As directed       Allergies as of 03/03/2023       Reactions   Almond Oil Anaphylaxis   Almonds    Banana Anaphylaxis   Benadryl [diphenhydramine Hcl] Anaphylaxis   Penicillins Anaphylaxis        Medication List     TAKE these medications    amLODipine 10 MG tablet Commonly known as: NORVASC Take 1 tablet (10 mg total) by mouth daily.   Geritol Liqd Take 15 mLs by mouth daily.   naproxen sodium 220 MG tablet Commonly known as: ALEVE Take 220 mg by mouth daily as needed (for headache).        Allergies  Allergen Reactions   Almond Oil Anaphylaxis    Almonds    Banana Anaphylaxis   Benadryl [Diphenhydramine Hcl] Anaphylaxis   Penicillins Anaphylaxis    Consultations: None   Procedures/Studies: ECHOCARDIOGRAM COMPLETE  Result Date: 03/03/2023    ECHOCARDIOGRAM REPORT   Patient Name:   Marilyn Owen Date of Exam: 03/03/2023 Medical Rec #:  [id number]    Height:       62.0 in Accession #:    [id number]   Weight:       165.0 lb Date of Birth:  1966/10/21     BSA:          1.762 m Patient Age:    57 years     BP:           141/91 mmHg Patient Gender: F  HR:           72 bpm. Exam Location:  Inpatient Procedure: 2D Echo, Cardiac Doppler and Color Doppler Indications:    Chest pain  History:        Patient has prior history of Echocardiogram examinations, most                 recent 08/21/2015. Previous Myocardial Infarction,                 Signs/Symptoms:Murmur; Risk Factors:Hypertension.  Sonographer:    Eartha Inch Referring Phys: Myles Rosenthal, A  Sonographer Comments: Image acquisition challenging due to respiratory motion. IMPRESSIONS  1. Left ventricular ejection fraction, by estimation, is 60 to 65%. The left ventricle has normal function. The left ventricle has no regional wall motion abnormalities.  There is mild concentric left ventricular hypertrophy. Left ventricular diastolic parameters are indeterminate.  2. Right ventricular systolic function is normal. The right ventricular size is normal.  3. The mitral valve is normal in structure. Trivial mitral valve regurgitation. No evidence of mitral stenosis.  4. The aortic valve is normal in structure. Aortic valve regurgitation is not visualized. No aortic stenosis is present.  5. The inferior vena cava is normal in size with greater than 50% respiratory variability, suggesting right atrial pressure of 3 mmHg. FINDINGS  Left Ventricle: Left ventricular ejection fraction, by estimation, is 60 to 65%. The left ventricle has normal function. The left ventricle has no regional wall motion abnormalities. The left ventricular internal cavity size was normal in size. There is  mild concentric left ventricular hypertrophy. Left ventricular diastolic parameters are indeterminate. Right Ventricle: The right ventricular size is normal. No increase in right ventricular wall thickness. Right ventricular systolic function is normal. Left Atrium: Left atrial size was normal in size. Right Atrium: Right atrial size was normal in size. Pericardium: There is no evidence of pericardial effusion. Mitral Valve: The mitral valve is normal in structure. Trivial mitral valve regurgitation. No evidence of mitral valve stenosis. MV peak gradient, 5.3 mmHg. The mean mitral valve gradient is 2.0 mmHg. Tricuspid Valve: The tricuspid valve is normal in structure. Tricuspid valve regurgitation is mild . No evidence of tricuspid stenosis. Aortic Valve: The aortic valve is normal in structure. Aortic valve regurgitation is not visualized. No aortic stenosis is present. Aortic valve mean gradient measures 9.0 mmHg. Aortic valve peak gradient measures 17.5 mmHg. Aortic valve area, by VTI measures 2.20 cm. Pulmonic Valve: The pulmonic valve was normal in structure. Pulmonic valve regurgitation  is trivial. No evidence of pulmonic stenosis. Aorta: The aortic root is normal in size and structure. Venous: The inferior vena cava is normal in size with greater than 50% respiratory variability, suggesting right atrial pressure of 3 mmHg. IAS/Shunts: No atrial level shunt detected by color flow Doppler.  LEFT VENTRICLE PLAX 2D LVIDd:         3.70 cm   Diastology LVIDs:         2.00 cm   LV e' medial:    6.09 cm/s LV PW:         1.00 cm   LV E/e' medial:  14.2 LV IVS:        1.00 cm   LV e' lateral:   11.10 cm/s LVOT diam:     1.90 cm   LV E/e' lateral: 7.8 LV SV:         83 LV SV Index:   47 LVOT Area:     2.84  cm  RIGHT VENTRICLE             IVC RV S prime:     16.80 cm/s  IVC diam: 0.90 cm TAPSE (M-mode): 3.4 cm LEFT ATRIUM             Index        RIGHT ATRIUM           Index LA diam:        2.90 cm 1.65 cm/m   RA Area:     13.90 cm LA Vol (A2C):   35.3 ml 20.04 ml/m  RA Volume:   32.50 ml  18.45 ml/m LA Vol (A4C):   30.2 ml 17.14 ml/m LA Biplane Vol: 34.9 ml 19.81 ml/m  AORTIC VALVE AV Area (Vmax):    2.48 cm AV Area (Vmean):   2.54 cm AV Area (VTI):     2.20 cm AV Vmax:           209.00 cm/s AV Vmean:          139.500 cm/s AV VTI:            0.374 m AV Peak Grad:      17.5 mmHg AV Mean Grad:      9.0 mmHg LVOT Vmax:         183.00 cm/s LVOT Vmean:        125.000 cm/s LVOT VTI:          0.291 m LVOT/AV VTI ratio: 0.78  AORTA Ao Root diam: 2.70 cm Ao Asc diam:  3.60 cm MITRAL VALVE               TRICUSPID VALVE MV Area (PHT): 2.83 cm    TR Peak grad:   26.4 mmHg MV Area VTI:   2.40 cm    TR Mean grad:   20.0 mmHg MV Peak grad:  5.3 mmHg    TR Vmax:        257.00 cm/s MV Mean grad:  2.0 mmHg    TR Vmean:       214.0 cm/s MV Vmax:       1.15 m/s MV Vmean:      67.6 cm/s   SHUNTS MV Decel Time: 268 msec    Systemic VTI:  0.29 m MV E velocity: 86.20 cm/s  Systemic Diam: 1.90 cm MV A velocity: 86.80 cm/s MV E/A ratio:  0.99 Kardie Tobb DO Electronically signed by Berniece Salines DO Signature Date/Time:  03/03/2023/3:02:45 PM    Final    CT Angio Chest Pulmonary Embolism (PE) W or WO Contrast  Result Date: 03/03/2023 CLINICAL DATA:  Suspected pulmonary embolism. EXAM: CT ANGIOGRAPHY CHEST WITH CONTRAST TECHNIQUE: Multidetector CT imaging of the chest was performed using the standard protocol during bolus administration of intravenous contrast. Multiplanar CT image reconstructions and MIPs were obtained to evaluate the vascular anatomy. RADIATION DOSE REDUCTION: This exam was performed according to the departmental dose-optimization program which includes automated exposure control, adjustment of the mA and/or kV according to patient size and/or use of iterative reconstruction technique. CONTRAST:  9m OMNIPAQUE IOHEXOL 350 MG/ML SOLN COMPARISON:  None Available. FINDINGS: Cardiovascular: Satisfactory opacification of the pulmonary arteries to the segmental level. No evidence of pulmonary embolism. Normal heart size. No pericardial effusion. Mediastinum/Nodes: No enlarged mediastinal, hilar, or axillary lymph nodes. Thyroid gland, trachea, and esophagus demonstrate no significant findings. Lungs/Pleura: 3 mm pleural based noncalcified lung nodule is seen within the posterolateral aspect of the right lower lobe (axial  CT image 96, CT series 6). There is no evidence of an acute infiltrate, pleural effusion or pneumothorax. Upper Abdomen: No acute abnormality. Musculoskeletal: No chest wall abnormality. No acute or significant osseous findings. Review of the MIP images confirms the above findings. IMPRESSION: 1. No evidence of pulmonary embolism or other acute intrathoracic process. 2. 3 mm pleural based noncalcified right lower lobe lung nodule. No follow-up needed if patient is low-risk.This recommendation follows the consensus statement: Guidelines for Management of Incidental Pulmonary Nodules Detected on CT Images: From the Fleischner Society 2017; Radiology 2017; 284:228-243. Electronically Signed   By:  Virgina Norfolk M.D.   On: 03/03/2023 07:15   DG Chest 2 View  Result Date: 03/02/2023 CLINICAL DATA:  Chest pain. EXAM: CHEST - 2 VIEW COMPARISON:  Chest x-ray 07/06/2021. FINDINGS: The heart size and mediastinal contours are within normal limits. Both lungs are clear. No visible pleural effusions or pneumothorax. No acute osseous abnormality. IMPRESSION: No active cardiopulmonary disease. Electronically Signed   By: Margaretha Sheffield M.D.   On: 03/02/2023 13:21   (Echo, Carotid, EGD, Colonoscopy, ERCP)    Subjective: Patient seen in the morning rounds.  Family at the bedside.  Patient denies any complaints at this time.  She never had precordial chest pain.  It was mostly on the shoulder blade pain and back pain.  Feels comfortable with plan to go home.   Discharge Exam: Vitals:   03/03/23 1445 03/03/23 1500  BP: (!) 149/95 (!) 146/95  Pulse: 95 86  Resp: 19 17  Temp:    SpO2: 100% 99%   Vitals:   03/03/23 1415 03/03/23 1430 03/03/23 1445 03/03/23 1500  BP: (!) 141/88 (!) 134/90 (!) 149/95 (!) 146/95  Pulse: 77 73 95 86  Resp: '17 18 19 17  '$ Temp:      TempSrc:      SpO2: 96% 98% 100% 99%  Weight:      Height:        General: Pt is alert, awake, not in acute distress Cardiovascular: RRR, S1/S2 +, no rubs, no gallops Respiratory: CTA bilaterally, no wheezing, no rhonchi, no tenderness.  No reproducibility. Abdominal: Soft, NT, ND, bowel sounds + Extremities: no edema, no cyanosis    The results of significant diagnostics from this hospitalization (including imaging, microbiology, ancillary and laboratory) are listed below for reference.     Microbiology: No results found for this or any previous visit (from the past 240 hour(s)).   Labs: BNP (last 3 results) No results for input(s): "BNP" in the last 8760 hours. Basic Metabolic Panel: Recent Labs  Lab 03/02/23 1252 03/03/23 0740  NA 138  --   K 3.4*  --   CL 104  --   CO2 25  --   GLUCOSE 82  --   BUN 10   --   CREATININE 0.76 0.77  CALCIUM 9.5  --    Liver Function Tests: Recent Labs  Lab 03/02/23 1252  AST 23  ALT 12  ALKPHOS 82  BILITOT 0.4  PROT 7.4  ALBUMIN 3.9   No results for input(s): "LIPASE", "AMYLASE" in the last 168 hours. No results for input(s): "AMMONIA" in the last 168 hours. CBC: Recent Labs  Lab 03/02/23 1252 03/03/23 0740  WBC 6.5 5.4  NEUTROABS 3.1  --   HGB 15.0 14.5  HCT 44.9 43.7  MCV 93.7 93.6  PLT 232 217   Cardiac Enzymes: No results for input(s): "CKTOTAL", "CKMB", "CKMBINDEX", "TROPONINI" in the last 168 hours.  BNP: Invalid input(s): "POCBNP" CBG: No results for input(s): "GLUCAP" in the last 168 hours. D-Dimer Recent Labs    03/03/23 0544  DDIMER 0.77*   Hgb A1c No results for input(s): "HGBA1C" in the last 72 hours. Lipid Profile No results for input(s): "CHOL", "HDL", "LDLCALC", "TRIG", "CHOLHDL", "LDLDIRECT" in the last 72 hours. Thyroid function studies No results for input(s): "TSH", "T4TOTAL", "T3FREE", "THYROIDAB" in the last 72 hours.  Invalid input(s): "FREET3" Anemia work up No results for input(s): "VITAMINB12", "FOLATE", "FERRITIN", "TIBC", "IRON", "RETICCTPCT" in the last 72 hours. Urinalysis    Component Value Date/Time   COLORURINE YELLOW 10/24/2013 1141   APPEARANCEUR HAZY (A) 10/24/2013 1141   LABSPEC 1.029 10/24/2013 1141   PHURINE 6.0 10/24/2013 1141   GLUCOSEU NEGATIVE 10/24/2013 1141   HGBUR NEGATIVE 10/24/2013 1141   BILIRUBINUR NEGATIVE 10/24/2013 1141   KETONESUR 15 (A) 10/24/2013 1141   PROTEINUR NEGATIVE 10/24/2013 1141   UROBILINOGEN 1.0 10/24/2013 1141   NITRITE NEGATIVE 10/24/2013 1141   LEUKOCYTESUR MODERATE (A) 10/24/2013 1141   Sepsis Labs Recent Labs  Lab 03/02/23 1252 03/03/23 0740  WBC 6.5 5.4   Microbiology No results found for this or any previous visit (from the past 240 hour(s)).   Time coordinating discharge:  35 minutes  SIGNED:   Barb Merino, MD  [facility] 03/03/2023, 3:03 PM

## 2023-03-03 NOTE — ED Provider Notes (Incomplete)
[facility] Provider Note  CSN: [id number] Arrival date & time: 03/02/23 1238  Chief Complaint(s) Chest Pain and Headache  HPI Marilyn Owen is a 57 y.o. female {Add pertinent medical, surgical, social history, OB history to HPI:1}    Chest Pain Associated symptoms: headache   Headache   Past Medical History Past Medical History:  Diagnosis Date  . Asthma   . Financial difficulties   . Hypertension   . MI (myocardial infarction) ([place])   . Migraine headache   . Murmur, cardiac    Patient Active Problem List   Diagnosis Date Noted  . Mixed hyperlipidemia 08/20/2021  . Hypokalemia 08/20/2021  . Tobacco use 08/19/2021  . Degeneration of lumbar intervertebral disc 10/16/2019  . Bradycardia 07/26/2015  . Dizziness 07/26/2015  . Multiple falls 07/26/2015  . Asthma, moderate persistent 05/11/2012  . HTN (hypertension) 09/22/2010   Home Medication(s) Prior to Admission medications   Medication Sig Start Date End Date Taking? Authorizing Provider  albuterol (VENTOLIN HFA) 108 (90 Base) MCG/ACT inhaler Inhale 2 puffs into the lungs every 4 (four) hours. 08/19/21   Elsie Stain, MD  amLODipine (NORVASC) 10 MG tablet Take 1 tablet (10 mg total) by mouth daily. 08/19/21   Elsie Stain, MD  atorvastatin (LIPITOR) 10 MG tablet Take 1 tablet (10 mg total) by mouth daily. 08/20/21   Elsie Stain, MD  fluticasone (FLOVENT HFA) 44 MCG/ACT inhaler Inhale 2 puffs into the lungs daily. 08/19/21   Elsie Stain, MD  ibuprofen (ADVIL) 200 MG tablet Take 200 mg by mouth every 6 (six) hours as needed for mild pain or moderate pain.    [provider]  methocarbamol (ROBAXIN) 500 MG tablet Take 2 tablets (1,000 mg total) by mouth every 8 (eight) hours as needed for muscle spasms. 07/08/21   Argentina Donovan, PA-C  multivitamin (GERI-TONIC) LIQD Take 15 mLs by mouth once a week.    [provider]  nicotine polacrilex  (NICOTINE MINI) 2 MG lozenge Take 1 lozenge (2 mg total) by mouth as needed for smoking cessation. 08/19/21   Elsie Stain, MD  potassium chloride (KLOR-CON) 10 MEQ tablet Take 1 tablet (10 mEq total) by mouth daily. 08/20/21   Elsie Stain, MD  valsartan (DIOVAN) 160 MG tablet Take 1 tablet (160 mg total) by mouth daily. 08/19/21   Elsie Stain, MD                                                                                                                                    Allergies Almond oil, Banana, Benadryl [diphenhydramine hcl], Other, and Penicillins  Review of Systems Review of Systems  Cardiovascular:  Positive for chest pain.  Neurological:  Positive for headaches.   As noted in HPI  Physical Exam Vital Signs  I have reviewed the triage vital signs BP (!) 163/100 (BP Location:  Right Arm)   Pulse 75   Temp 98.7 F (37.1 C) (Oral)   Resp 18   Ht '5\' 2"'$  (1.575 m)   Wt 74.8 kg   LMP 09/19/2017 Comment: shielded  SpO2 95%   BMI 30.18 kg/m  *** Physical Exam  ED Results and Treatments Labs (all labs ordered are listed, but only abnormal results are displayed) Labs Reviewed  COMPREHENSIVE METABOLIC PANEL - Abnormal; Notable for the following components:      Result Value   Potassium 3.4 (*)    All other components within normal limits  CBC WITH DIFFERENTIAL/PLATELET  TROPONIN I (HIGH SENSITIVITY)  TROPONIN I (HIGH SENSITIVITY)                                                                                                                         EKG  EKG Interpretation  Date/Time:  Wednesday March 02 2023 22:44:30 EDT Ventricular Rate:  67 PR Interval:  146 QRS Duration: 76 QT Interval:  442 QTC Calculation: 467 R Axis:   84 Text Interpretation: Sinus rhythm Borderline T abnormalities, anterior leads Confirmed by Addison Lank [phone]) on 03/02/2023 11:07:00 PM       Radiology DG Chest 2 View  Result Date: 03/02/2023 CLINICAL DATA:  Chest  pain. EXAM: CHEST - 2 VIEW COMPARISON:  Chest x-ray 07/06/2021. FINDINGS: The heart size and mediastinal contours are within normal limits. Both lungs are clear. No visible pleural effusions or pneumothorax. No acute osseous abnormality. IMPRESSION: No active cardiopulmonary disease. Electronically Signed   By: Margaretha Sheffield M.D.   On: 03/02/2023 13:21    Medications Ordered in ED Medications - No data to display                                                                                                                                   Procedures Procedures  (including critical care time)  Medical Decision Making / ED Course  Click here for ABCD2, HEART and other calculators  Medical Decision Making Risk OTC drugs. Prescription drug management.          Final Clinical Impression(s) / ED Diagnoses Final diagnoses:  None    {Document critical care time when appropriate:1}  {Document review of labs and clinical decision tools ie heart score, Chads2Vasc2 etc:1}  {Document your independent review of radiology images, and any outside records:1} {Document your discussion with family members, caretakers, and  with consultants:1} {Document social determinants of health affecting pt's care:1} {Document your decision making why or why not admission, treatments were needed:1} This chart was dictated using voice recognition software.  Despite best efforts to proofread,  errors can occur which can change the documentation meaning.

## 2023-03-03 NOTE — H&P (Signed)
History and Physical    Marilyn Owen [id number] DOB: 1966/06/03 DOA: 03/02/2023  PCP: Elsie Stain, MD  Patient coming from: home  I have personally briefly reviewed patient's old medical records in [place]  Chief Complaint: uncontrolled hypertension, chest pain pain pain , HA  HPI: Marilyn Owen is a 56 y.o. female with medical history significant of  Asthma, hypertension , NSTEMI s/p cath noted clean C's over 10 years ago , Migraine  Cardiac murmur who presents to ED with back pain and chest pain x 1 week with worsening progression over the last few days. Patient described pain as pressure like sensation that worse with exertion. Patient notes nausea as well as associated shortness of breath and HA.  Patient notes she has not been compliant with diet in reference to salt intake.  Patient states she initially thought her back and chest pain. Patient in attempt to relieve he symptoms followed up with chiropractor. ON evaluation in office patient was found to  have blood pressure in 200's and was referred to ED.  Patient current states after treatment in ED patient HA has resolved however her chest pain is a 6/10.    ED Course:   Vitals:  Afeb, bp 188/128, rr 16 sat hr 98  sat 100 % on ra  EKG: NSR nonspecific t-wave in d lateal leads Wbc 6.5, hgb 15, plt 232 N a 138/k3.4, cr 0.76   Review of Systems: As per HPI otherwise 10 point review of systems negative.   Past Medical History:  Diagnosis Date   Asthma    Financial difficulties    Hypertension    MI (myocardial infarction) ([facility])    Migraine headache    Murmur, cardiac     Past Surgical History:  Procedure Laterality Date   APPENDECTOMY     CARDIAC CATHETERIZATION     tubal ligation        reports that she has been smoking cigarettes. She uses smokeless tobacco. She reports current alcohol use. She reports current drug use. Drug: Marijuana.  Allergies  Allergen Reactions   Almond Oil Anaphylaxis     Almonds    Banana Anaphylaxis   Benadryl [Diphenhydramine Hcl] Anaphylaxis   Penicillins Anaphylaxis    Family History  Problem Relation Age of Onset   Hypertension Mother    Heart attack Mother    Heart attack Father     Prior to Admission medications   Medication Sig Start Date End Date Taking? Authorizing Provider  amLODipine (NORVASC) 10 MG tablet Take 1 tablet (10 mg total) by mouth daily. 08/19/21  Yes Elsie Stain, MD  Iron-Vitamins (GERITOL) LIQD Take 15 mLs by mouth daily.   Yes [provider]  naproxen sodium (ALEVE) 220 MG tablet Take 220 mg by mouth daily as needed (for headache).   Yes [provider]    Physical Exam: Vitals:   03/02/23 2300 03/03/23 0030 03/03/23 0200 03/03/23 0234  BP: (!) 170/108 (!) 204/108 (!) 175/103   Pulse: 69 (!) 58 (!) 57   Resp: '20 16 17   '$ Temp:    98.8 F (37.1 C)  TempSrc:      SpO2: 100% 98% 98%   Weight:      Height:        Constitutional: NAD, calm, comfortable Vitals:   03/02/23 2300 03/03/23 0030 03/03/23 0200 03/03/23 0234  BP: (!) 170/108 (!) 204/108 (!) 175/103   Pulse: 69 (!) 58 (!) 57   Resp: 20 16  17   Temp:    98.8 F (37.1 C)  TempSrc:      SpO2: 100% 98% 98%   Weight:      Height:       Eyes: PERRL, lids and conjunctivae normal ENMT: Mucous membranes are moist. Posterior pharynx clear of any exudate or lesions.Normal dentition.  Neck: normal, supple, no masses, no thyromegaly Respiratory: clear to auscultation bilaterally, no wheezing, no crackles. Normal respiratory effort. No accessory muscle use.  Cardiovascular: Regular rate and rhythm, no murmurs / rubs / gallops. No extremity edema. 2+ pedal pulsesbruits.  Abdomen: no tenderness, no masses palpated. No hepatosplenomegaly. Bowel sounds positive.  Musculoskeletal: no clubbing / cyanosis. No joint deformity upper and lower extremities. Good ROM, no contractures. Normal muscle tone.  Skin: no rashes, lesions, ulcers. No  induration Neurologic: CN 2-12 grossly intact. Sensation intact, DTR normal. Strength 5/5 in all 4.  Psychiatric: Normal judgment and insight. Alert and oriented x 3. Normal mood.    Labs on Admission: I have personally reviewed following labs and imaging studies  CBC: Recent Labs  Lab 03/02/23 1252  WBC 6.5  NEUTROABS 3.1  HGB 15.0  HCT 44.9  MCV 93.7  PLT [id number]   Basic Metabolic Panel: Recent Labs  Lab 03/02/23 1252  NA 138  K 3.4*  CL 104  CO2 25  GLUCOSE 82  BUN 10  CREATININE 0.76  CALCIUM 9.5   GFR: Estimated Creatinine Clearance: 76.1 mL/min (by C-G formula based on SCr of 0.76 mg/dL). Liver Function Tests: Recent Labs  Lab 03/02/23 1252  AST 23  ALT 12  ALKPHOS 82  BILITOT 0.4  PROT 7.4  ALBUMIN 3.9   No results for input(s): "LIPASE", "AMYLASE" in the last 168 hours. No results for input(s): "AMMONIA" in the last 168 hours. Coagulation Profile: No results for input(s): "INR", "PROTIME" in the last 168 hours. Cardiac Enzymes: No results for input(s): "CKTOTAL", "CKMB", "CKMBINDEX", "TROPONINI" in the last 168 hours. BNP (last 3 results) No results for input(s): "PROBNP" in the last 8760 hours. HbA1C: No results for input(s): "HGBA1C" in the last 72 hours. CBG: No results for input(s): "GLUCAP" in the last 168 hours. Lipid Profile: No results for input(s): "CHOL", "HDL", "LDLCALC", "TRIG", "CHOLHDL", "LDLDIRECT" in the last 72 hours. Thyroid Function Tests: No results for input(s): "TSH", "T4TOTAL", "FREET4", "T3FREE", "THYROIDAB" in the last 72 hours. Anemia Panel: No results for input(s): "VITAMINB12", "FOLATE", "FERRITIN", "TIBC", "IRON", "RETICCTPCT" in the last 72 hours. Urine analysis:    Component Value Date/Time   COLORURINE YELLOW 10/24/2013 1141   APPEARANCEUR HAZY (A) 10/24/2013 1141   LABSPEC 1.029 10/24/2013 1141   PHURINE 6.0 10/24/2013 1141   GLUCOSEU NEGATIVE 10/24/2013 1141   HGBUR NEGATIVE 10/24/2013 1141   BILIRUBINUR  NEGATIVE 10/24/2013 1141   KETONESUR 15 (A) 10/24/2013 1141   PROTEINUR NEGATIVE 10/24/2013 1141   UROBILINOGEN 1.0 10/24/2013 1141   NITRITE NEGATIVE 10/24/2013 1141   LEUKOCYTESUR MODERATE (A) 10/24/2013 1141    Radiological Exams on Admission: DG Chest 2 View  Result Date: 03/02/2023 CLINICAL DATA:  Chest pain. EXAM: CHEST - 2 VIEW COMPARISON:  Chest x-ray 07/06/2021. FINDINGS: The heart size and mediastinal contours are within normal limits. Both lungs are clear. No visible pleural effusions or pneumothorax. No acute osseous abnormality. IMPRESSION: No active cardiopulmonary disease. Electronically Signed   By: Margaretha Sheffield M.D.   On: 03/02/2023 13:21    EKG: Independently reviewed. See above   Assessment/Plan  Chest pain r/o acs -pressure  like exertional  -improve with nitro and improvement of blood pressure -presumed due to uncontrolled hypertension however other cardiac etiology not ruled out at this time -EKG - no hyperacute findings , CE wnl  - admit to tele ,chest pain protocol  -continue with nitro paste , morphine prn for pain  -asa -check lipid - echo / stress test in am if patient chest pain free  D-dimer Elevated  -f/u on CTPE   CAD s/p NSTEMI  -remote history  -per patient has cath with clean c/s at that time    Uncontrolled Hypertension  -resume home regimen with amlodipine  -patient also had ARB on her prior list but is not taking this medication unclear reason  - prn hydralazine    Asthma -no current exacerbation  -continue on flovent    NSTEMI s/p cath noted clean C's over 10 years ago , Migraine  Cardiac murmur     DVT prophylaxis:  Code Status: full/ as discussed per patient wishes in event of cardiac arrest  Family Communication: Alternate Contact Person   +3 more Lawhorn,Terrance (Spouse) [phone] ([place])  Disposition Plan: patient  expected to be admitted greater than 2 midnights  Consults called: n/a consider cardiology  based further results  Admission status: .cardiac tele   Clance Boll MD [facility]  If 7PM-7AM, please contact night-coverage [email or web address] Password [facility]  03/03/2023, 5:31 AM

## 2023-03-03 NOTE — Progress Notes (Signed)
  Echocardiogram 2D Echocardiogram has been performed.  Eartha Inch 03/03/2023, 2:47 PM

## 2023-03-04 ENCOUNTER — Encounter: Payer: Self-pay | Admitting: *Deleted

## 2023-03-04 ENCOUNTER — Encounter: Payer: Self-pay | Admitting: Interventional Cardiology

## 2023-03-04 ENCOUNTER — Ambulatory Visit: Payer: Self-pay | Attending: Interventional Cardiology | Admitting: Interventional Cardiology

## 2023-03-04 VITALS — BP 138/88 | HR 74 | Ht 63.0 in | Wt 154.6 lb

## 2023-03-04 DIAGNOSIS — I10 Essential (primary) hypertension: Secondary | ICD-10-CM

## 2023-03-04 DIAGNOSIS — R072 Precordial pain: Secondary | ICD-10-CM

## 2023-03-04 DIAGNOSIS — Z8249 Family history of ischemic heart disease and other diseases of the circulatory system: Secondary | ICD-10-CM

## 2023-03-04 MED ORDER — METOPROLOL TARTRATE 50 MG PO TABS: ORAL_TABLET | ORAL | 0 refills | Status: DC

## 2023-03-04 NOTE — Progress Notes (Signed)
Cardiology Office Note   Date:  03/04/2023   ID:  Marilyn Owen, DOB [birth year], MRN [id number]  PCP:  Elsie Stain, MD    No chief complaint on file.  Chest pain  Wt Readings from Last 3 Encounters:  03/04/23 154 lb 9.6 oz (70.1 kg)  03/02/23 165 lb (74.8 kg)  08/19/21 168 lb 6.4 oz (76.4 kg)       History of Present Illness: Marilyn Owen is a 57 y.o. female who is being seen today for the evaluation of chest pain at the request of Marilyn Merino, MD.  H/o heart cath in 2001 in [facility].  No PCI.  No CAD.  She has lost weight since then, 80 lbs, with diet and exercise.  In 2016: "Event recorder reviewed in detail. Lightheadedness and one episode of passing out associated with normal sinus rhythm, sinus bradycardia and sinus tachycardia. Her echocardiogram is also normal with no evidence of mitral valve prolapse. She is not orthostatic.   Her episodes of lightheadedness, weakness, and passing out or not cardiac related. Please reassure her that her heart is normal on echocardiogram, and that her event recorder does not support or suggest any kind of arrhythmia. No further cardiac workup indicated. Reassurance at this time. No need for cardiac follow-up."  Went to the emergency room on March 02, 2023 with records showing: "she has been having left shoulder girdle muscle pain for several weeks.  Has been going to the chiropractor.  Reports that she went in the last 2 days.  Started having chest pain this morning around 10 AM described as tight/pressure different from asthma exacerbation.  Chest pain was exertional but nonradiating.  It was associated with shortness of breath.  Patient did report nausea and vomiting.  No abdominal pain.   Patient also noted that she had elevated blood pressures stating that she is currently on amlodipine and has been compliant with her medications. She also stated that she has been having intermittent migrainous headaches for the past  2 weeks.   No fevers or chills.  No cough or congestion.  No other physical complaints."   "EKG with new T wave changes compared to previous. Serial troponins negative x 2, 10 hours apart   CBC without leukocytosis or anemia Metabolic panel without significant electrolyte derangements or renal sufficiency Chest x-ray without evidence of pneumonia, pneumothorax, pulmonary edema or pleural effusions   Patient provided with nitroglycerin paste to treat the chest pain and hypertension. Chest pain did improve after nitroglycerin."  Echo in 3/24: "Left ventricular ejection fraction, by estimation, is 60 to 65%. The  left ventricle has normal function. The left ventricle has no regional  wall motion abnormalities. There is mild concentric left ventricular  hypertrophy. Left ventricular diastolic  parameters are indeterminate.   2. Right ventricular systolic function is normal. The right ventricular  size is normal.   3. The mitral valve is normal in structure. Trivial mitral valve  regurgitation. No evidence of mitral stenosis.   4. The aortic valve is normal in structure. Aortic valve regurgitation is  not visualized. No aortic stenosis is present.   5. The inferior vena cava is normal in size with greater than 50%  respiratory variability, suggesting right atrial pressure of 3 mmHg. "  Was not taking amlodipine regularly, but since restarting, BP has decreased to < 140/90.  [facility]cks [facility].  Will soon have a cuff at home.   Left scapular pain.  Worse with exercising.  Feels muscular to her but she is concerned about her heart.     Past Medical History:  Diagnosis Date   Asthma    Financial difficulties    Hypertension    MI (myocardial infarction) ([place])    Migraine headache    Murmur, cardiac     Past Surgical History:  Procedure Laterality Date   APPENDECTOMY     CARDIAC CATHETERIZATION     ESOPHAGOGASTRODUODENOSCOPY (EGD) WITH PROPOFOL  1999   second in 2001   tubal  ligation        Current Outpatient Medications  Medication Sig Dispense Refill   albuterol (VENTOLIN HFA) 108 (90 Base) MCG/ACT inhaler Inhale 1 puff into the lungs every 6 (six) hours as needed for wheezing or shortness of breath.     amLODipine (NORVASC) 10 MG tablet Take 1 tablet (10 mg total) by mouth daily. 90 tablet 1   Iron-Vitamins (GERITOL) LIQD Take 15 mLs by mouth daily.     naproxen sodium (ALEVE) 220 MG tablet Take 220 mg by mouth daily as needed (for headache).     No current facility-administered medications for this visit.    Allergies:   Almond oil, Banana, Benadryl [diphenhydramine hcl], and Penicillins    Social History:  The patient  reports that she quit smoking 4 days ago. Her smoking use included cigarettes. She has a 1.25 pack-year smoking history. She uses smokeless tobacco. She reports current alcohol use. She reports current drug use. Drug: Marijuana.   Family History:  The patient's family history includes Breast cancer in her mother; Epilepsy in her father; Heart attack in her father and mother; Hyperlipidemia in her mother; Hypertension in her father and mother; Seizures in her father; Uterine cancer in her mother.    ROS:  Please see the history of present illness.   Otherwise, review of systems are positive for scapular pain.   All other systems are reviewed and negative.    PHYSICAL EXAM: VS:  BP 138/88   Pulse 74   Ht 5\' 3"  (1.6 m)   Wt 154 lb 9.6 oz (70.1 kg)   LMP 09/19/2017 Comment: shielded  SpO2 94%   BMI 27.39 kg/m  , BMI Body mass index is 27.39 kg/m. GEN: Well nourished, well developed, in no acute distress HEENT: normal Neck: no JVD, carotid bruits, or masses Cardiac: RRR; no murmurs, rubs, or gallops,no edema  Respiratory:  clear to auscultation bilaterally, normal work of breathing GI: soft, nontender, nondistended, + BS MS: no deformity or atrophy Skin: warm and dry, no rash Neuro:  Strength and sensation are intact Psych:  euthymic mood, full affect   EKG:   The ekg ordered 03/03/23  demonstrates sinus bradycardia, LVH, NSST   Recent Labs: 03/02/2023: ALT 12; BUN 10; Potassium 3.4; Sodium 138 03/03/2023: Creatinine, Ser 0.77; Hemoglobin 14.5; Platelets 217   Lipid Panel    Component Value Date/Time   CHOL 200 (H) 08/19/2021 1125   TRIG 61 08/19/2021 1125   HDL 84 08/19/2021 1125   CHOLHDL 2.4 08/19/2021 1125   CHOLHDL 2.5 07/30/2015 0908   VLDL 9 07/30/2015 0908   LDLCALC 105 (H) 08/19/2021 1125     Other studies Reviewed: Additional studies/ records that were reviewed today with results demonstrating: LDL 2022.   ASSESSMENT AND PLAN:  Scapular pain/chest pain: Given the family history of early heart disease in the father, would check a CT angiogram to rule out any severe coronary artery disease.  Works at [employer], but  has to do multiple jobs.  Heart rate was 58 on resting ECG.  Will give metoprolol 50 mg prior to CTA. HTN: needs to be compliant with BP meds.   Whole food, plant-based diet.High fiber diet. Increase exercise to target below.   Tobacco abuse: Continue to abstain from smoking.   Family history of early CAD in her father who had an MI at age 64.   Current medicines are reviewed at length with the patient today.  The patient concerns regarding her medicines were addressed.  The following changes have been made:  No change  Labs/ tests ordered today include: CTA coronaries: metoprolol 50 mg x 1 prior- HR 58 on resting ECG No orders of the defined types were placed in this encounter.   Recommend 150 minutes/week of aerobic exercise Low fat, low carb, high fiber diet recommended  Disposition:   FU in based on test results   Signed, Larae Grooms, MD  03/04/2023 11:10 AM    [facility] [place], [place], [place]  [address] Phone: [phone]; Fax: [phone]

## 2023-03-04 NOTE — Patient Instructions (Addendum)
Medication Instructions:  Your physician recommends that you continue on your current medications as directed. Please refer to the Current Medication list given to you today.  *If you need a refill on your cardiac medications before your next appointment, please call your pharmacy*   Lab Work: none If you have labs (blood work) drawn today and your tests are completely normal, you will receive your results only by: [place] (if you have MyChart) OR A paper copy in the mail If you have any lab test that is abnormal or we need to change your treatment, we will call you to review the results.   Testing/Procedures: Your physician has requested that you have cardiac CT. Cardiac computed tomography (CT) is a painless test that uses an x-ray machine to take clear, detailed pictures of your heart. For further information please visit [email or web address]. Please follow instruction sheet as given.     Follow-Up: At [facility], you and your health needs are our priority.  As part of our continuing mission to provide you with exceptional heart care, we have created designated Provider Care Teams.  These Care Teams include your primary Cardiologist (physician) and Advanced Practice Providers (APPs -  Physician Assistants and Nurse Practitioners) who all work together to provide you with the care you need, when you need it.  We recommend signing up for the patient portal called "MyChart".  Sign up information is provided on this After Visit Summary.  MyChart is used to connect with patients for Virtual Visits (Telemedicine).  Patients are able to view lab/test results, encounter notes, upcoming appointments, etc.  Non-urgent messages can be sent to your provider as well.   To learn more about what you can do with MyChart, go to [email or web address].    Your next appointment:   Based on test results  Provider:   Larae Grooms, MD     Other Instructions     Your cardiac  CT will be scheduled at one of the below locations:   [facility] [address] [place], [place] [address] [phone]  [facility] [address] [place], [place] [address] [phone]  [facility] [place], [place] [address] [phone]  If scheduled at [facility], please arrive at the [facility] and Children's Entrance (Entrance C2) of [facility] 30 minutes prior to test start time. You can use the FREE valet parking offered at entrance C (encouraged to control the heart rate for the test)  Proceed to the [facility] Radiology Department (first floor) to check-in and test prep.  All radiology patients and guests should use entrance C2 at [facility], accessed from [facility], even though the hospital's physical address listed is [address]    If scheduled at [facility] or [facility], please arrive 15 mins early for check-in and test prep.   Please follow these instructions carefully (unless otherwise directed):  [name]   On the Night Before the Test: Be sure to Drink plenty of water. Do not consume any caffeinated/decaffeinated beverages or chocolate 12 hours prior to your test. Do not take any antihistamines 12 hours prior to your test.   On the Day of the Test: Drink plenty of water until 1 hour prior to the test. Do not eat any food 1 hour prior to test. You may take your regular medications prior to the  test.  Take metoprolol (Lopressor) two hours prior to test. If you take Furosemide/Hydrochlorothiazide/Spironolactone, please HOLD on the morning of the test. FEMALES- please wear underwire-free bra if available, avoid dresses & tight clothing         After the Test: Drink plenty of water. After receiving IV contrast, you may experience a mild flushed  feeling. This is normal. On occasion, you may experience a mild rash up to 24 hours after the test. This is not dangerous. If this occurs, you can take Benadryl 25 mg and increase your fluid intake. If you experience trouble breathing, this can be serious. If it is severe call 911 IMMEDIATELY. If it is mild, please call our office. If you take any of these medications: Glipizide/Metformin, Avandament, Glucavance, please do not take 48 hours after completing test unless otherwise instructed.  We will call to schedule your test 2-4 weeks out understanding that some insurance companies will need an authorization prior to the service being performed.   For non-scheduling related questions, please contact the cardiac imaging nurse navigator should you have any questions/concerns: Marchia Bond, Cardiac Imaging Nurse Navigator Gordy Clement, Cardiac Imaging Nurse Navigator [facility] Direct Office Dial: [phone]   For scheduling needs, including cancellations and rescheduling, please call [place], [phone].

## 2023-03-09 ENCOUNTER — Telehealth (HOSPITAL_COMMUNITY): Payer: Self-pay | Admitting: *Deleted

## 2023-03-09 ENCOUNTER — Telehealth (HOSPITAL_COMMUNITY): Payer: Self-pay | Admitting: Emergency Medicine

## 2023-03-09 NOTE — Telephone Encounter (Signed)
Patient returning call about her upcoming cardiac imaging study; pt verbalizes understanding of appt date/time, parking situation and where to check in, pre-test NPO status and medications ordered, and verified current allergies; name and call back number provided for further questions should they arise  Gordy Clement RN Navigator Cardiac [facility] [facility] [phone] office [phone] cell  Patient to take 50mg  metoprolol tartrate if her HR is greater than 65bpm TWO hours prior to her cardiac CT scan. She is aware to arrive at 1:30pm.

## 2023-03-09 NOTE — Telephone Encounter (Signed)
Attempted to call patient regarding upcoming cardiac CT appointment. °Left message on voicemail with name and callback number °Nature Kueker RN Navigator Cardiac Imaging °[facility] °[phone] Office °[phone] Cell ° °

## 2023-03-10 ENCOUNTER — Ambulatory Visit (HOSPITAL_COMMUNITY)
Admission: RE | Admit: 2023-03-10 | Discharge: 2023-03-10 | Disposition: A | Discharge status: Home / Self Care | Payer: Self-pay | Source: Ambulatory Visit | Attending: Interventional Cardiology | Admitting: Interventional Cardiology

## 2023-03-10 DIAGNOSIS — R072 Precordial pain: Secondary | ICD-10-CM | POA: Insufficient documentation

## 2023-03-10 MED ORDER — NITROGLYCERIN 0.4 MG SL SUBL
0.8000 mg | SUBLINGUAL_TABLET | Freq: Once | SUBLINGUAL | Status: AC
  Administered 2023-03-10: 0.8 mg via SUBLINGUAL

## 2023-03-10 MED ORDER — IOHEXOL 350 MG/ML SOLN
95.0000 mL | Freq: Once | INTRAVENOUS | Status: AC | PRN
  Administered 2023-03-10: 95 mL via INTRAVENOUS

## 2023-03-10 MED ORDER — NITROGLYCERIN 0.4 MG SL SUBL
SUBLINGUAL_TABLET | SUBLINGUAL | Status: AC
  Filled 2023-03-10: qty 2

## 2023-04-21 ENCOUNTER — Ambulatory Visit: Payer: Self-pay | Admitting: Physician Assistant

## 2023-05-02 ENCOUNTER — Other Ambulatory Visit: Payer: Self-pay

## 2023-05-02 ENCOUNTER — Ambulatory Visit: Payer: Self-pay | Attending: Physician Assistant | Admitting: Physician Assistant

## 2023-05-02 ENCOUNTER — Encounter: Payer: Self-pay | Admitting: Critical Care Medicine

## 2023-05-02 ENCOUNTER — Ambulatory Visit: Payer: Self-pay | Admitting: Physician Assistant

## 2023-05-02 VITALS — BP 162/92 | HR 60 | Temp 98.1°F | Ht 63.0 in | Wt 175.0 lb

## 2023-05-02 DIAGNOSIS — M62838 Other muscle spasm: Secondary | ICD-10-CM

## 2023-05-02 DIAGNOSIS — Z09 Encounter for follow-up examination after completed treatment for conditions other than malignant neoplasm: Secondary | ICD-10-CM

## 2023-05-02 DIAGNOSIS — E782 Mixed hyperlipidemia: Secondary | ICD-10-CM

## 2023-05-02 DIAGNOSIS — R072 Precordial pain: Secondary | ICD-10-CM

## 2023-05-02 DIAGNOSIS — I10 Essential (primary) hypertension: Secondary | ICD-10-CM

## 2023-05-02 DIAGNOSIS — R2689 Other abnormalities of gait and mobility: Secondary | ICD-10-CM

## 2023-05-02 DIAGNOSIS — E876 Hypokalemia: Secondary | ICD-10-CM

## 2023-05-02 MED ORDER — DICLOFENAC SODIUM 1 % EX GEL
2.0000 g | Freq: Four times a day (QID) | CUTANEOUS | 3 refills | Status: AC
  Filled 2023-05-02: qty 100, 13d supply, fill #0

## 2023-05-02 MED ORDER — AMLODIPINE BESYLATE 10 MG PO TABS
10.0000 mg | ORAL_TABLET | Freq: Every day | ORAL | 1 refills | Status: DC
  Filled 2023-05-02: qty 90, 90d supply, fill #0

## 2023-05-02 MED ORDER — HYDROCHLOROTHIAZIDE 12.5 MG PO CAPS
12.5000 mg | ORAL_CAPSULE | Freq: Every day | ORAL | 1 refills | Status: DC
  Filled 2023-05-02: qty 90, 90d supply, fill #0

## 2023-05-02 NOTE — Patient Instructions (Addendum)
Check blood pressure daily after sitting still and quiet for 5 mins.  Record these numbers and bring to your next visit   Hypokalemia Hypokalemia means that the amount of potassium in the blood is lower than normal. Potassium is a mineral (electrolyte) that helps regulate the amount of fluid in the body. It also stimulates muscle tightening (contraction) and helps nerves work properly. Normally, most of the body's potassium is inside cells, and only a very small amount is in the blood. Because the amount in the blood is so small, minor changes to potassium levels in the blood can be life-threatening. What are the causes? This condition may be caused by: Antibiotic medicine. Diarrhea or vomiting. Taking too much of a medicine that helps you have a bowel movement (laxative) can cause diarrhea and lead to hypokalemia. Chronic kidney disease (CKD). Medicines that help the body get rid of excess fluid (diuretics). Eating disorders, such as anorexia or bulimia. Low magnesium levels in the body. Sweating a lot. What are the signs or symptoms? Symptoms of this condition include: Weakness. Constipation. Fatigue. Muscle cramps. Mental confusion. Skipped heartbeats or irregular heartbeat (palpitations). Tingling or numbness. How is this diagnosed? This condition is diagnosed with a blood test. How is this treated? This condition may be treated by: Taking potassium supplements. Adjusting the medicines that you take. Eating more foods that contain a lot of potassium. If your potassium level is very low, you may need to get potassium through an IV and be monitored in the hospital. Follow these instructions at home: Eating and drinking  Eat a healthy diet. A healthy diet includes fresh fruits and vegetables, whole grains, healthy fats, and lean proteins. If told, eat more foods that contain a lot of potassium. These include: Nuts, such as peanuts and pistachios. Seeds, such as sunflower seeds  and pumpkin seeds. Peas, lentils, and lima beans. Whole grain and bran cereals and breads. Fresh fruits and vegetables, such as apricots, avocado, bananas, cantaloupe, kiwi, oranges, tomatoes, asparagus, and potatoes. Juices, such as orange, tomato, and prune. Lean meats, including fish. Milk and milk products, such as yogurt. General instructions Take over-the-counter and prescription medicines only as told by your health care provider. This includes vitamins, natural food products, and supplements. Keep all follow-up visits. This is important. Contact a health care provider if: You have weakness that gets worse. You feel your heart pounding or racing. You vomit. You have diarrhea. You have diabetes and you have trouble keeping your blood sugar in your target range. Get help right away if: You have chest pain. You have shortness of breath. You have vomiting or diarrhea that lasts for more than 2 days. You faint. These symptoms may be an emergency. Get help right away. Call 911. Do not wait to see if the symptoms will go away. Do not drive yourself to the hospital. Summary Hypokalemia means that the amount of potassium in the blood is lower than normal. This condition is diagnosed with a blood test. Hypokalemia may be treated by taking potassium supplements, adjusting the medicines that you take, or eating more foods that are high in potassium. If your potassium level is very low, you may need to get potassium through an IV and be monitored in the hospital. This information is not intended to replace advice given to you by your health care provider. Make sure you discuss any questions you have with your health care provider. Document Revised: 08/20/2021 Document Reviewed: 08/20/2021 Elsevier Patient Education  2023 [employer].

## 2023-05-02 NOTE — Progress Notes (Signed)
Patient ID: Marilyn Owen, female   DOB: 04/11/1966, 57 y.o.   MRN: [id number]     Marilyn Owen, is a 57 y.o. female  [id number]  [id number]  DOB - Feb 05, 1966  No chief complaint on file.      Subjective:   Marilyn Owen is a 57 y.o. female here today for a follow up visit after being seen at the ED with uncontrolled htn and CP and back of neck pain.  She has already seen cardiology since the ED visit.  No further CP.  No SOB.  She does s/o pain in the trapezius are several times a week.  She does get stressed out at her work.  No radiating pain.    She also c/o being off balance and she falls sometimes.  It appears this started in 2016 and she has had work ups.  Some white matter changes seen on MRI in 2018.  She says she is compliant with amlodipine but last Rx seen in chart was 2022    From ED note 03/02/2023:  Marilyn Owen is a 57 y.o. female with a past medical history listed below who presents to the emergency department with several complaints.  Mostly here for chest pain, encouraged to come by her chiropractor.   Patient reports that she has been having left shoulder girdle muscle pain for several weeks.  Has been going to the chiropractor.  Reports that she went in the last 2 days.  Started having chest pain this morning around 10 AM described as tight/pressure different from asthma exacerbation.  Chest pain was exertional but nonradiating.  It was associated with shortness of breath.  Patient did report nausea and vomiting.  No abdominal pain.   Patient also noted that she had elevated blood pressures stating that she is currently on amlodipine and has been compliant with her medications. She also stated that she has been having intermittent migrainous headaches for the past 2 weeks.   No fevers or chills.  No cough or congestion.  No other physical complaints.    Chest pain With typical features Differential includes ACS, hypertensive urgency.  Also considering MSK  given her back pain.  Low suspicion for dissection or pulmonary embolism.   EKG with new T wave changes compared to previous. Serial troponins negative x 2, 10 hours apart   CBC without leukocytosis or anemia Metabolic panel without significant electrolyte derangements or renal sufficiency Chest x-ray without evidence of pneumonia, pneumothorax, pulmonary edema or pleural effusions   Patient provided with nitroglycerin paste to treat the chest pain and hypertension. Chest pain did improve after nitroglycerin.   Additionally patient was complaining of typical migrainous headache.  No focal deficits on exam.  Had low suspicion for ICH.  Patient was given Tylenol and Compazine which provided significant relief of her headache.   Case was discussed with Dr. Maisie Fus from hospitalist service who agreed to admit patient for further workup and management.  No problems updated.  ALLERGIES: Allergies  Allergen Reactions   Almond Oil Anaphylaxis    Almonds    Banana Anaphylaxis   Benadryl [Diphenhydramine Hcl] Anaphylaxis   Penicillins Anaphylaxis    PAST MEDICAL HISTORY: Past Medical History:  Diagnosis Date   Asthma    Financial difficulties    Hypertension    MI (myocardial infarction) (HCC)    Migraine headache    Murmur, cardiac     MEDICATIONS AT HOME: Prior to Admission medications   Medication Sig Start Date End Date Taking? Authorizing  Provider  albuterol (VENTOLIN HFA) 108 (90 Base) MCG/ACT inhaler Inhale 1 puff into the lungs every 6 (six) hours as needed for wheezing or shortness of breath.   Yes [provider]  diclofenac Sodium (VOLTAREN ARTHRITIS PAIN) 1 % GEL Apply 2 g topically 4 (four) times daily. 05/02/23  Yes Georgian Co M, PA-C  hydrochlorothiazide (MICROZIDE) 12.5 MG capsule Take 1 capsule (12.5 mg total) by mouth daily. 05/02/23  Yes Lamondre Wesche M, PA-C  Iron-Vitamins (GERITOL) LIQD Take 15 mLs by mouth daily.   Yes [provider]   naproxen sodium (ALEVE) 220 MG tablet Take 220 mg by mouth daily as needed (for headache).   Yes [provider]  amLODipine (NORVASC) 10 MG tablet Take 1 tablet (10 mg total) by mouth daily. 05/02/23   Onnie Hatchel, Marzella Schlein, PA-C    ROS: Neg HEENT Neg resp Neg cardiac Neg GI Neg GU Neg psych  Objective:   Vitals:   05/02/23 1507  BP: (!) 161/95  Pulse: 60  Temp: 98.1 F (36.7 C)  SpO2: 100%  Weight: 175 lb (79.4 kg)  Height: 5\' 3"  (1.6 m)   Exam General appearance : Awake, alert, not in any distress. Speech Clear. Not toxic looking HEENT: Atraumatic and Normocephalic Neck: Supple, no JVD. No cervical lymphadenopathy.  Chest: Good air entry bilaterally, CTAB.  No rales/rhonchi/wheezing CVS: S1 S2 regular, no murmurs.  Extremities: B/L Lower Ext shows no edema, both legs are warm to touch Neurology: Awake alert, and oriented X 3, CN II-XII intact, Non focal; ; unsteady heel to toe walking and unsteady gait but she is able to correct without losing balance.  Finger to nose reveals some depth perception problems.  Heel to shin is normal.   Skin: No Rash  Data Review Lab Results  Component Value Date   HGBA1C 5.3 08/19/2021   HGBA1C 5.0 11/08/2013    Assessment & Plan   1. Poor balance Not new but worsening with some white matter changes on previous MRI - Ambulatory referral to Neurology  2. Hypokalemia Eat potassium rich foods as I am going to add HCTZ 12.5 for htn (K+=3.4) - Basic metabolic panel; Future  3. Mixed hyperlipidemia  4. Primary hypertension Uncertain about compliance as stated above.  Check BP OOO and record and bring to follow up.  Check BMP at 1 month follow up - hydrochlorothiazide (MICROZIDE) 12.5 MG capsule; Take 1 capsule (12.5 mg total) by mouth daily.  Dispense: 90 capsule; Refill: 1 - amLODipine (NORVASC) 10 MG tablet; Take 1 tablet (10 mg total) by mouth daily.  Dispense: 90 tablet; Refill: 1 - Basic metabolic panel; Future  5.  Precordial pain Resolved and has been seen by cardiology  6. Encounter for examination following treatment at hospital  7. Muscle spasm - diclofenac Sodium (VOLTAREN ARTHRITIS PAIN) 1 % GEL; Apply 2 g topically 4 (four) times daily.  Dispense: 100 g; Refill: 3    Return for 1 month with Franky Macho for blood pressure and labs-Dr Delford Field in 3 months.  The patient was given clear instructions to go to ER or return to medical center if symptoms don't improve, worsen or new problems develop. The patient verbalized understanding. The patient was told to call to get lab results if they haven't heard anything in the next week.      Georgian Co, PA-C [facility] [place], [place] [phone]   05/02/2023, 4:09 PM

## 2023-05-02 NOTE — Progress Notes (Signed)
Pt is here for BP f/u   Complaining of neck and left shoulder pain for X2 months No injury to same per pt   States her BP was elevated at her last appt with her chiropractor, they sent her to the ER diagnosed with a TIA and she is here to f/u on same

## 2023-05-03 ENCOUNTER — Encounter: Payer: Self-pay | Admitting: Neurology

## 2023-05-06 ENCOUNTER — Other Ambulatory Visit: Payer: Self-pay

## 2023-06-02 ENCOUNTER — Ambulatory Visit: Payer: Self-pay | Admitting: Pharmacist

## 2023-07-29 ENCOUNTER — Other Ambulatory Visit: Payer: Self-pay

## 2023-07-29 ENCOUNTER — Encounter: Payer: Self-pay | Admitting: Critical Care Medicine

## 2023-07-29 NOTE — Progress Notes (Deleted)
error 

## 2023-07-29 NOTE — Progress Notes (Addendum)
   Kimberley Gummo 01-Jan-1966 [id number]  Patient is having dizziness and doesn't seem to be tolerating her hydrochlorothiazide. She also has back pain which causes headaches. Discussed NSAID use but it seems that ibuprofen is the only drug that provides relief for pain. Pain seems to be nociceptive in nature. Also, she is having insurance concerns so wonder if she could be referred for Medicaid.  Patient outreached by Mack Guise , PharmD Candidate on 07/29/2023.  Blood Pressure Readings: Last documented ambulatory systolic blood pressure: 162 Last documented ambulatory diastolic blood pressure: 92 Systolic BP today: 151 Diastolic BP today: 101 Does the patient have a validated home blood pressure machine?: Yes They report home readings NA  Medication review was performed.   Differences from their prescribed list include: NA  The following barriers to adherence were noted: Does the patient have cost concerns?: Yes Does the patient have transportation concerns?: No Does the patient need assistance obtaining refills?: No Does the patient occassionally forget to take some of their prescribed medications?: No Does the patient feel like one/some of their medications make them feel poorly?: Yes Does the patient have questions or concerns about their medications?: Yes Does the patient have a follow up scheduled with their primary care provider/cardiologist?: Yes   Interventions: Interventions Completed: Medications were reviewed, Patient was educated on goal blood pressures and long term health implications of elevated blood pressure  The patient has follow up scheduled:  PCP: Storm Frisk, MD   Mack Guise, Student-PharmD

## 2023-08-01 NOTE — Progress Notes (Unsigned)
New Patient Office Visit  Subjective:  Patient ID: Marilyn Owen, female    DOB: [birth year]  Age: 57 y.o. MRN: [id number]  CC:  No chief complaint on file.   HPI Marilyn Owen presents for primary care to establish.  Previously had been seen in July by [facility] Documentation from that visit was below Saw  mcclung 06/2021 After ED visit 07/06/2021 for CP and SOB.  Work-up was neg/reassuring..  Breathing better with using inhaler.  BP is improving on increased dose of amlodipine.  She is still having pleuritic CP with movement and with taking a deep breath.  Tylenol is not helping much.  No paresthesias.  No chest pressure.  No radiating pain. Does not feel up to working   From ED note: Patient is a pleasant 57 year old female presenting today with several complaints.  Most immediate complaint is of a squeezing chest pain in the left side of her chest that is pleuritic and worse with movement.  She has mild decreased breath sounds but no other focal complaints.  She has not had a cough or wheezing.  She denies fever but has had diarrhea in the last 24 hours.  She has no focal abdominal tenderness here concerning for diverticulitis appendicitis or other acute abdominal process.  Patient's blood pressure is elevated here most recent number is 174/113.  She did take 5 mg of amlodipine earlier today when she woke up.  She does report that her blood pressure is usually elevated between 1 60-1 70 even with the amlodipine.  Patient's EKG today without acute changes.  Chest x-ray is within normal limits.  CBC and BMP and troponin are all normal.  Patient's chest pain is nonspecific.  Low suspicion for ACS at this time given patient's symptoms have been present for greater than 12 hours and troponin is negative and EKG is unchanged.  It is pleuritic in nature and worsened with certain movements.  Concern for possible asthma exacerbation just without wheezing.  Also concern for her hypertension but feel this is  more chronic.  Also with the diarrhea concern for possible COVID.  D-dimer is negative with a lower suspicion for PE.  Chest x-ray is clear without signs of pneumonia.  We will give a second dose of 5 mg of amlodipine.  Will give patient albuterol to see if that helps with her symptoms.  COVID test is pending.   12:12 AM Labs are reassuring.  Pt's symptoms improved after inhaler.  Bp improved with additional amlodipine.  We will have patient increase amlodipine to 10 mg daily.  No evidence to suggest ACS at this time.  Patient in general is feeling better   Observations/Objective: NAD.  A&Ox3   Assessment and Plan: 1. Pleuritic chest pain - ibuprofen (ADVIL) 600 MG tablet; Take 1 tablet (600 mg total) by mouth every 8 (eight) hours as needed.  Dispense: 60 tablet; Refill: 0   2. Muscle spasm - methocarbamol (ROBAXIN) 500 MG tablet; Take 2 tablets (1,000 mg total) by mouth every 8 (eight) hours as needed for muscle spasms.  Dispense: 90 tablet; Refill: 0   3. Primary hypertension Continue amlodipine 10mg  daily and continue home BP readings   4. Extrinsic asthma with acute exacerbation, unspecified asthma severity, unspecified whether persistent Inhaler prn   5. Encounter for examination following treatment at hospital Improving OOW note for this week   Since that visit the patient's chest pain has somewhat resolved she has had mild wheezing with her albuterol use increased.  She maintains amlodipine 10 mg daily but on arrival today blood pressure still elevated 142/90.  She has episodes where she has gait disturbance and frequent falls.  She is fallen and hurt her foot.  Upon further inquiry she is having visual field cuts peripherally which make it difficult for her to ambulate.  Patient does continue to smoke about 1 cigarette a day.  Patient also had history of snoring and sleep apnea in the past but is not on CPAP therapy.  Patient is due a tetanus vaccine and she will receive it.  She  was screened for HIV and hepatitis C in 2010 was negative.  She had a cath at 57 years of age when she had a chest pain syndrome the catheter study was negative.  She was told she had an acute myocardial infarction at that time.  She is also had spells of syncope in the past with bradycardia.  She had a visit in 2016 with cardiology and had a 24-hour monitor placed with no significant events.  Echocardiogram was unremarkable.  Patient did have an MRI of the brain in 2018 as documented below showed mild white matter changes  Patient works in Clinical research associate at [employer]    Past Medical History:  Diagnosis Date   Asthma    Financial difficulties    Hypertension    MI (myocardial infarction) (HCC)    Migraine headache    Murmur, cardiac     Past Surgical History:  Procedure Laterality Date   APPENDECTOMY     CARDIAC CATHETERIZATION     ESOPHAGOGASTRODUODENOSCOPY (EGD) WITH PROPOFOL  1999   second in 2001   tubal ligation       Family History  Problem Relation Age of Onset   Hypertension Mother    Heart attack Mother    Breast cancer Mother    Uterine cancer Mother    Hyperlipidemia Mother    Heart attack Father    Hypertension Father    Seizures Father    Epilepsy Father     Social History   Socioeconomic History   Marital status: Married    Spouse name: Not on file   Number of children: 4   Years of education: Not on file   Highest education level: Associate degree: academic program  Occupational History   Not on file  Tobacco Use   Smoking status: Former    Current packs/day: 0.00    Average packs/day: 0.3 packs/day for 5.0 years (1.3 ttl pk-yrs)    Types: Cigarettes    Start date: 02/27/2018    Quit date: 02/28/2023    Years since quitting: 0.4   Smokeless tobacco: Current  Vaping Use   Vaping status: Never Used  Substance and Sexual Activity   Alcohol use: Yes    Comment: occ   Drug use: Yes    Types: Marijuana   Sexual activity: Yes    Birth  control/protection: None  Other Topics Concern   Not on file  Social History Narrative   Not on file   Social Determinants of Health   Financial Resource Strain: Medium Risk (05/02/2023)   Overall Financial Resource Strain (CARDIA)    Difficulty of Paying Living Expenses: Somewhat hard  Food Insecurity: Not on file  Transportation Needs: No Transportation Needs (05/02/2023)   PRAPARE - Administrator, Civil Service (Medical): No    Lack of Transportation (Non-Medical): No  Physical Activity: Sufficiently Active (05/02/2023)   Exercise Vital Sign  Days of Exercise per Week: 3 days    Minutes of Exercise per Session: 150+ min  Stress: Stress Concern Present (05/02/2023)   [employer] of Occupational Health - Occupational Stress Questionnaire    Feeling of Stress : To some extent  Social Connections: Moderately Integrated (05/02/2023)   Social Connection and Isolation Panel [NHANES]    Frequency of Communication with Friends and Family: Three times a week    Frequency of Social Gatherings with Friends and Family: Once a week    Attends Religious Services: 1 to 4 times per year    Active Member of [employer] or Organizations: No    Attends Engineer, structural: Not on file    Marital Status: Married  Catering manager Violence: Not on file    ROS Review of Systems  HENT: Negative.    Eyes:  Positive for visual disturbance.  Respiratory:  Positive for shortness of breath and wheezing. Negative for cough.   Cardiovascular:  Positive for chest pain and palpitations. Negative for leg swelling.  Gastrointestinal: Negative.   Genitourinary: Negative.   Musculoskeletal:  Positive for gait problem.  Neurological:  Positive for dizziness.  Hematological: Negative.   Psychiatric/Behavioral: Negative.      Objective:   Today's Vitals: LMP 09/19/2017 Comment: shielded  Physical Exam Constitutional:      General: She is not in acute distress.    Appearance: She  is obese.  HENT:     Head: Normocephalic and atraumatic.     Nose: Nose normal.     Mouth/Throat:     Mouth: Mucous membranes are moist.     Pharynx: Oropharynx is clear.  Eyes:     Extraocular Movements: Extraocular movements intact.     Pupils: Pupils are equal, round, and reactive to light.     Comments: Apparent visual field cut bilaterally  Cardiovascular:     Rate and Rhythm: Normal rate and regular rhythm.     Pulses: Normal pulses.     Heart sounds: Normal heart sounds.  Pulmonary:     Effort: Pulmonary effort is normal.     Breath sounds: Normal breath sounds.  Abdominal:     General: Abdomen is flat. Bowel sounds are normal.     Palpations: Abdomen is soft.  Musculoskeletal:        General: Normal range of motion.  Skin:    General: Skin is warm and dry.  Neurological:     General: No focal deficit present.     Mental Status: She is alert and oriented to person, place, and time. Mental status is at baseline.     Cranial Nerves: No cranial nerve deficit.     Sensory: No sensory deficit.     Motor: No weakness.     Coordination: Coordination abnormal.     Gait: Gait abnormal.     Deep Tendon Reflexes: Reflexes normal.     Comments: Performs very poorly with finger-to-nose study  Psychiatric:        Mood and Affect: Mood normal.        Behavior: Behavior normal.        Thought Content: Thought content normal.        Judgment: Judgment normal.     MRI 2018 IMPRESSION: 1. No acute finding. 2. Mild cerebral white matter disease presumably related to patient's history of hypertension and migraines. 3. 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 Assessment & Plan:   Problem List Items Addressed This Visit  None    Outpatient Encounter Medications as of 08/02/2023  Medication Sig   albuterol (VENTOLIN HFA) 108 (90 Base) MCG/ACT inhaler Inhale 1 puff into the lungs every 6 (six) hours as needed for wheezing or  shortness of breath.   amLODipine (NORVASC) 10 MG tablet Take 1 tablet (10 mg total) by mouth daily.   diclofenac Sodium (VOLTAREN ARTHRITIS PAIN) 1 % GEL Apply 2 g topically 4 (four) times daily.   hydrochlorothiazide (MICROZIDE) 12.5 MG capsule Take 1 capsule (12.5 mg total) by mouth daily.   Iron-Vitamins (GERITOL) LIQD Take 15 mLs by mouth daily.   naproxen sodium (ALEVE) 220 MG tablet Take 220 mg by mouth daily as needed (for headache).   No facility-administered encounter medications on file as of 08/02/2023.    Follow-up: No follow-ups on file.   Shan Levans, MD

## 2023-08-02 ENCOUNTER — Other Ambulatory Visit: Payer: Self-pay

## 2023-08-02 ENCOUNTER — Ambulatory Visit: Payer: Self-pay | Attending: Critical Care Medicine | Admitting: Critical Care Medicine

## 2023-08-02 ENCOUNTER — Encounter: Payer: Self-pay | Admitting: Critical Care Medicine

## 2023-08-02 VITALS — BP 165/105 | HR 68 | Wt 170.2 lb

## 2023-08-02 DIAGNOSIS — E876 Hypokalemia: Secondary | ICD-10-CM | POA: Insufficient documentation

## 2023-08-02 DIAGNOSIS — Z720 Tobacco use: Secondary | ICD-10-CM

## 2023-08-02 DIAGNOSIS — Z1211 Encounter for screening for malignant neoplasm of colon: Secondary | ICD-10-CM

## 2023-08-02 DIAGNOSIS — Z1231 Encounter for screening mammogram for malignant neoplasm of breast: Secondary | ICD-10-CM

## 2023-08-02 DIAGNOSIS — M62838 Other muscle spasm: Secondary | ICD-10-CM | POA: Insufficient documentation

## 2023-08-02 DIAGNOSIS — I10 Essential (primary) hypertension: Secondary | ICD-10-CM | POA: Insufficient documentation

## 2023-08-02 DIAGNOSIS — F17210 Nicotine dependence, cigarettes, uncomplicated: Secondary | ICD-10-CM | POA: Insufficient documentation

## 2023-08-02 DIAGNOSIS — J45901 Unspecified asthma with (acute) exacerbation: Secondary | ICD-10-CM | POA: Insufficient documentation

## 2023-08-02 DIAGNOSIS — R2689 Other abnormalities of gait and mobility: Secondary | ICD-10-CM | POA: Insufficient documentation

## 2023-08-02 DIAGNOSIS — Z9181 History of falling: Secondary | ICD-10-CM | POA: Insufficient documentation

## 2023-08-02 DIAGNOSIS — E782 Mixed hyperlipidemia: Secondary | ICD-10-CM | POA: Insufficient documentation

## 2023-08-02 DIAGNOSIS — R0781 Pleurodynia: Secondary | ICD-10-CM | POA: Insufficient documentation

## 2023-08-02 MED ORDER — AMLODIPINE BESYLATE 10 MG PO TABS
10.0000 mg | ORAL_TABLET | Freq: Every day | ORAL | 1 refills | Status: DC
  Filled 2023-08-02: qty 90, 90d supply, fill #0

## 2023-08-02 MED ORDER — VALSARTAN 320 MG PO TABS
320.0000 mg | ORAL_TABLET | Freq: Every day | ORAL | 3 refills | Status: DC
  Filled 2023-08-02: qty 90, 90d supply, fill #0

## 2023-08-02 MED ORDER — CHLORTHALIDONE 25 MG PO TABS
25.0000 mg | ORAL_TABLET | Freq: Every day | ORAL | 2 refills | Status: DC
  Filled 2023-08-02: qty 90, 90d supply, fill #0

## 2023-08-02 NOTE — Patient Instructions (Addendum)
Stop hydrochlorothiazide Start chlorthalidone 1 daily for blood pressure Start valsartan and daily for blood pressure Today on amlodipine daily Screening labs repeated See nurse 3 weeks for blood pressure recheck Dr. Delford Field 6 weeks Reduce tobacco intake use a nicotine replacement over-the-counter and see attachment Take melatonin 10 mg an hour before you go to bed for sleep when she gets insurance we can do a sleep study  For your falls we will have you see neurology once you get insurance

## 2023-08-02 NOTE — Assessment & Plan Note (Signed)
Hypertension poorly controlled continue amlodipine 10 mg daily begin chlorthalidone 25 mg daily discontinue hydrochlorothiazide and begin valsartan 320 daily  patient to come back for nurse recheck of blood pressure return to this clinic 6 weeks

## 2023-08-02 NOTE — Assessment & Plan Note (Signed)
Mild tobacco use    . Current smoking consumption amount: 1 cigarette a day  . Dicsussion on advise to quit smoking and smoking impacts: Cardiovascular lung impacts  . Patient's willingness to quit: Wants to quit  . Methods to quit smoking discussed: Behavioral modification medication management  . Medication management of smoking session drugs discussed: Nicotine lozenge  . Resources provided:  AVS   . Setting quit date 12 weeks  . Follow-up arranged 2 months   Time spent counseling the patient: 5 minutes

## 2023-08-03 ENCOUNTER — Other Ambulatory Visit: Payer: Self-pay | Admitting: Critical Care Medicine

## 2023-08-03 ENCOUNTER — Other Ambulatory Visit: Payer: Self-pay

## 2023-08-03 ENCOUNTER — Telehealth: Payer: Self-pay

## 2023-08-03 MED ORDER — ATORVASTATIN CALCIUM 10 MG PO TABS
10.0000 mg | ORAL_TABLET | Freq: Every day | ORAL | 3 refills | Status: DC
  Filled 2023-08-03: qty 90, 90d supply, fill #0

## 2023-08-03 NOTE — Telephone Encounter (Signed)
I recommend we start ibuprofen 400mg  every 6 hours as needed for pain cramps

## 2023-08-03 NOTE — Telephone Encounter (Signed)
Pt was called and is aware of results, DOB was confirmed.  ?

## 2023-08-03 NOTE — Progress Notes (Signed)
Let pt know cholesterol is very high I sent a low dose of a cholesterol pill to our pharmacy all other labs normal

## 2023-08-03 NOTE — Telephone Encounter (Signed)
-----   Message from Shan Levans sent at 08/03/2023  6:13 AM EDT ----- Let pt know cholesterol is very high I sent a low dose of a cholesterol pill to our pharmacy all other labs normal

## 2023-08-04 ENCOUNTER — Other Ambulatory Visit: Payer: Self-pay

## 2023-08-17 ENCOUNTER — Telehealth: Payer: Self-pay

## 2023-08-17 NOTE — Telephone Encounter (Signed)
Telephoned patient at mobile number. Patient will call back at a later time and complete mammogram scholarship screening.

## 2023-08-18 ENCOUNTER — Encounter: Payer: Self-pay | Admitting: Physician Assistant

## 2023-08-18 ENCOUNTER — Other Ambulatory Visit (HOSPITAL_COMMUNITY)
Admission: RE | Admit: 2023-08-18 | Discharge: 2023-08-18 | Disposition: A | Discharge status: Home / Self Care | Payer: Self-pay | Source: Ambulatory Visit | Attending: Physician Assistant | Admitting: Physician Assistant

## 2023-08-18 ENCOUNTER — Ambulatory Visit: Payer: Self-pay | Attending: Physician Assistant | Admitting: Physician Assistant

## 2023-08-18 VITALS — BP 138/88 | Wt 168.4 lb

## 2023-08-18 DIAGNOSIS — Z124 Encounter for screening for malignant neoplasm of cervix: Secondary | ICD-10-CM

## 2023-08-18 NOTE — Patient Instructions (Signed)
Pap Test Why am I having this test? A Pap test, also called a Pap smear, is a screening test to check for signs of: Infection. Cancer of the cervix. The cervix is the lower part of the uterus that opens into the vagina. Changes that may be a sign that cancer is developing (precancerous changes). Women need this test on a regular basis. In general, you should have a Pap test every 3 years until you reach menopause or age 55. Women aged 30-60 may choose to have their Pap test done at the same time as an HPV (human papillomavirus) test every 5 years (instead of every 3 years). Your health care provider may recommend having Pap tests more or less often depending on your medical conditions and past Pap test results. What is being tested? Cervical cells are tested for signs of infection or abnormalities. What kind of sample is taken?  Your health care provider will collect a sample of cells from the surface of your cervix. This will be done using a small cotton swab, plastic spatula, or brush that is inserted into your vagina using a tool called a speculum. This sample is often collected during a pelvic exam, when you are lying on your back on an exam table with your feet in footrests (stirrups). In some cases, fluids (secretions) from the cervix or vagina may also be collected. How do I prepare for this test? Be aware of where you are in your menstrual cycle. If you are menstruating on the day of the test, you may be asked to reschedule. You may need to reschedule if you have a known vaginal infection on the day of the test. Follow instructions from your health care provider about: Changing or stopping your regular medicines. Some medicines can cause abnormal test results, such as vaginal medicines and tetracycline. Avoiding douching 2-3 days before or the day of the test. Tell a health care provider about: Any allergies you have. All medicines you are taking, including vitamins, herbs, eye drops,  creams, and over-the-counter medicines. Any bleeding problems you have. Any surgeries you have had. Any medical conditions you have. Whether you are pregnant or may be pregnant. How are the results reported? Your test results will be reported as either abnormal or normal. What do the results mean? A normal test result means that you do not have signs of cancer of the cervix. An abnormal result may mean that you have: Cancer. A Pap test by itself is not enough to diagnose cancer. You will have more tests done if cancer is suspected. Precancerous changes in your cervix. Inflammation of the cervix. An STI (sexually transmitted infection). A fungal infection. A parasite infection. Talk with your health care provider about what your results mean. In some cases, your health care provider may do more testing to confirm the results. Questions to ask your health care provider Ask your health care provider, or the department that is doing the test: When will my results be ready? How will I get my results? What are my treatment options? What other tests do I need? What are my next steps? Summary In general, women should have a Pap test every 3 years until they reach menopause or age 55. Your health care provider will collect a sample of cells from the surface of your cervix. This will be done using a small cotton swab, plastic spatula, or brush. In some cases, fluids (secretions) from the cervix or vagina may also be collected. This information is not   intended to replace advice given to you by your health care provider. Make sure you discuss any questions you have with your health care provider. Document Revised: 03/06/2021 Document Reviewed: 03/06/2021 Elsevier Patient Education  2024 Elsevier Inc.  

## 2023-08-18 NOTE — Progress Notes (Signed)
Patient ID: Marilyn Owen, female   DOB: 02-28-1966, 57 y.o.   MRN: [id number]   Marilyn Owen, is a 57 y.o. female  SAY:[id number]  ATF:[id number]  DOB - 06/23/1966  Chief Complaint  Patient presents with   Gynecologic Exam       Subjective:   Marilyn Owen is a 57 y.o. female here today for pap.  She had an abnormal pap many years ago and sounds as though she had colposcopy.  Has not had pap in years. No vaginal discharge.  Menopausal.  Monogamous with husband.  H/o BTL  No problems updated.  ALLERGIES: Allergies  Allergen Reactions   Almond Oil Anaphylaxis    Almonds    Banana Anaphylaxis   Benadryl [Diphenhydramine Hcl] Anaphylaxis   Penicillins Anaphylaxis    PAST MEDICAL HISTORY: Past Medical History:  Diagnosis Date   Asthma    Financial difficulties    Hypertension    MI (myocardial infarction) (HCC)    Migraine headache    Murmur, cardiac     MEDICATIONS AT HOME: Prior to Admission medications   Medication Sig Start Date End Date Taking? Authorizing Provider  albuterol (VENTOLIN HFA) 108 (90 Base) MCG/ACT inhaler Inhale 1 puff into the lungs every 6 (six) hours as needed for wheezing or shortness of breath.    [provider]  amLODipine (NORVASC) 10 MG tablet Take 1 tablet (10 mg total) by mouth daily. 08/02/23   Storm Frisk, MD  atorvastatin (LIPITOR) 10 MG tablet Take 1 tablet (10 mg total) by mouth daily. 08/03/23   Storm Frisk, MD  chlorthalidone (HYGROTON) 25 MG tablet Take 1 tablet (25 mg total) by mouth daily. 08/02/23   Storm Frisk, MD  diclofenac Sodium (VOLTAREN ARTHRITIS PAIN) 1 % GEL Apply 2 g topically 4 (four) times daily. 05/02/23   Anders Simmonds, PA-C  Iron-Vitamins (GERITOL) LIQD Take 15 mLs by mouth daily.    [provider]  naproxen sodium (ALEVE) 220 MG tablet Take 220 mg by mouth daily as needed (for headache).    [provider]  valsartan (DIOVAN) 320 MG tablet Take 1 tablet (320 mg total) by  mouth daily. 08/02/23   Storm Frisk, MD    ROS: Neg HEENT Neg resp Neg cardiac Neg GI Neg GU Neg MS Neg psych Neg neuro  Objective:   Vitals:   08/18/23 1046  BP: 138/88  Weight: 168 lb 6.4 oz (76.4 kg)   Exam General appearance : Awake, alert, not in any distress. Speech Clear. Not toxic looking HEENT: Atraumatic and Normocephalic GU:  external genitalia WNL.  2 different speculums tried and various augmentation techniques but cervix retroverted and retroflexed-I had to do a blind pap.  Bimanual unremarkable  Extremities: B/L Lower Ext shows no edema, both legs are warm to touch Neurology: Awake alert, and oriented X 3, CN II-XII intact, Non focal Skin: No Rash  Data Review Lab Results  Component Value Date   HGBA1C 5.3 08/19/2021   HGBA1C 5.0 11/08/2013    Assessment & Plan   1. Cervical cancer screening - Cytology - PAP([place]) Was unable to get good view of cervix-will refer if specimen was inadequate Astroglide lubricant for SA   Return if symptoms worsen or fail to improve.  The patient was given clear instructions to go to ER or return to medical center if symptoms don't improve, worsen or new problems develop. The patient verbalized understanding. The patient was told to call to get lab results  if they haven't heard anything in the next week.      Georgian Co, PA-C [facility] and [facility] [place], [place] [phone]   08/18/2023, 11:27 AM

## 2023-08-23 ENCOUNTER — Ambulatory Visit: Payer: Self-pay

## 2023-08-25 ENCOUNTER — Other Ambulatory Visit: Payer: Self-pay | Admitting: Physician Assistant

## 2023-08-25 ENCOUNTER — Other Ambulatory Visit: Payer: Self-pay

## 2023-08-25 LAB — CYTOLOGY - PAP
Adequacy: ABSENT
Comment: NEGATIVE
Diagnosis: NEGATIVE
High risk HPV: NEGATIVE

## 2023-08-25 MED ORDER — METRONIDAZOLE 500 MG PO TABS
500.0000 mg | ORAL_TABLET | Freq: Two times a day (BID) | ORAL | 0 refills | Status: DC
  Filled 2023-08-25: qty 14, 7d supply, fill #0

## 2023-08-26 ENCOUNTER — Telehealth: Payer: Self-pay

## 2023-08-26 NOTE — Telephone Encounter (Signed)
Pt was called and no vm was left due to mailbox being full. Information has been sent to nurse pool.  

## 2023-08-26 NOTE — Telephone Encounter (Signed)
-----   Message from Georgian Co sent at 08/25/2023  1:23 PM EDT ----- Pap smear was normal except for bacterial vaginosis.  I sent a prescription for this.  Next pap in 3 years.  Thanks, Georgian Co, PA-C

## 2023-08-31 ENCOUNTER — Other Ambulatory Visit: Payer: Self-pay

## 2023-09-15 ENCOUNTER — Other Ambulatory Visit: Payer: Self-pay

## 2023-09-15 ENCOUNTER — Ambulatory Visit: Payer: Self-pay | Attending: Critical Care Medicine | Admitting: Critical Care Medicine

## 2023-09-15 ENCOUNTER — Telehealth: Payer: Self-pay | Admitting: Pharmacist

## 2023-09-15 ENCOUNTER — Encounter: Payer: Self-pay | Admitting: Critical Care Medicine

## 2023-09-15 VITALS — BP 158/103 | HR 61 | Wt 168.8 lb

## 2023-09-15 DIAGNOSIS — F17210 Nicotine dependence, cigarettes, uncomplicated: Secondary | ICD-10-CM | POA: Insufficient documentation

## 2023-09-15 DIAGNOSIS — R296 Repeated falls: Secondary | ICD-10-CM | POA: Insufficient documentation

## 2023-09-15 DIAGNOSIS — I10 Essential (primary) hypertension: Secondary | ICD-10-CM | POA: Insufficient documentation

## 2023-09-15 DIAGNOSIS — E876 Hypokalemia: Secondary | ICD-10-CM | POA: Insufficient documentation

## 2023-09-15 DIAGNOSIS — Z720 Tobacco use: Secondary | ICD-10-CM

## 2023-09-15 DIAGNOSIS — J4540 Moderate persistent asthma, uncomplicated: Secondary | ICD-10-CM | POA: Insufficient documentation

## 2023-09-15 MED ORDER — VALSARTAN 320 MG PO TABS
320.0000 mg | ORAL_TABLET | Freq: Every day | ORAL | 3 refills | Status: AC
  Filled 2023-09-15: qty 90, 90d supply, fill #0

## 2023-09-15 MED ORDER — ALBUTEROL SULFATE HFA 108 (90 BASE) MCG/ACT IN AERS
1.0000 | INHALATION_SPRAY | Freq: Four times a day (QID) | RESPIRATORY_TRACT | 1 refills | Status: AC | PRN
  Filled 2023-09-15: qty 6.7, 50d supply, fill #0

## 2023-09-15 MED ORDER — AMLODIPINE BESYLATE 10 MG PO TABS
10.0000 mg | ORAL_TABLET | Freq: Every day | ORAL | 1 refills | Status: AC
  Filled 2023-09-15: qty 90, 90d supply, fill #0

## 2023-09-15 MED ORDER — CHLORTHALIDONE 25 MG PO TABS
25.0000 mg | ORAL_TABLET | Freq: Every day | ORAL | 2 refills | Status: AC
  Filled 2023-09-15: qty 90, 90d supply, fill #0

## 2023-09-15 MED ORDER — BENZONATATE 100 MG PO CAPS
100.0000 mg | ORAL_CAPSULE | Freq: Two times a day (BID) | ORAL | 0 refills | Status: AC | PRN
  Filled 2023-09-15: qty 20, 10d supply, fill #0

## 2023-09-15 MED ORDER — CARVEDILOL 12.5 MG PO TABS
12.5000 mg | ORAL_TABLET | Freq: Two times a day (BID) | ORAL | 3 refills | Status: AC
  Filled 2023-09-15: qty 120, 60d supply, fill #0

## 2023-09-15 MED ORDER — ATORVASTATIN CALCIUM 10 MG PO TABS
10.0000 mg | ORAL_TABLET | Freq: Every day | ORAL | 3 refills | Status: AC
  Filled 2023-09-15: qty 90, 90d supply, fill #0

## 2023-09-15 NOTE — Progress Notes (Signed)
New Patient Office Visit  Subjective:  Patient ID: Marilyn Owen, female    DOB: [birth year]  Age: 57 y.o. MRN: [id number]  CC:  Chief Complaint  Patient presents with   Cough   Hypertension    HPI 07/2021 Marilyn Owen presents for primary care to establish.  Previously had been seen in July by [facility] Documentation from that visit was below Saw  mcclung 06/2021 After ED visit 07/06/2021 for CP and SOB.  Work-up was neg/reassuring..  Breathing better with using inhaler.  BP is improving on increased dose of amlodipine.  She is still having pleuritic CP with movement and with taking a deep breath.  Tylenol is not helping much.  No paresthesias.  No chest pressure.  No radiating pain. Does not feel up to working   From ED note: Patient is a pleasant 57 year old female presenting today with several complaints.  Most immediate complaint is of a squeezing chest pain in the left side of her chest that is pleuritic and worse with movement.  She has mild decreased breath sounds but no other focal complaints.  She has not had a cough or wheezing.  She denies fever but has had diarrhea in the last 24 hours.  She has no focal abdominal tenderness here concerning for diverticulitis appendicitis or other acute abdominal process.  Patient's blood pressure is elevated here most recent number is 174/113.  She did take 5 mg of amlodipine earlier today when she woke up.  She does report that her blood pressure is usually elevated between 1 60-1 70 even with the amlodipine.  Patient's EKG today without acute changes.  Chest x-ray is within normal limits.  CBC and BMP and troponin are all normal.  Patient's chest pain is nonspecific.  Low suspicion for ACS at this time given patient's symptoms have been present for greater than 12 hours and troponin is negative and EKG is unchanged.  It is pleuritic in nature and worsened with certain movements.  Concern for possible asthma exacerbation just without wheezing.  Also  concern for her hypertension but feel this is more chronic.  Also with the diarrhea concern for possible COVID.  D-dimer is negative with a lower suspicion for PE.  Chest x-ray is clear without signs of pneumonia.  We will give a second dose of 5 mg of amlodipine.  Will give patient albuterol to see if that helps with her symptoms.  COVID test is pending.   12:12 AM Labs are reassuring.  Pt's symptoms improved after inhaler.  Bp improved with additional amlodipine.  We will have patient increase amlodipine to 10 mg daily.  No evidence to suggest ACS at this time.  Patient in general is feeling better   Observations/Objective: NAD.  A&Ox3   Assessment and Plan: 1. Pleuritic chest pain - ibuprofen (ADVIL) 600 MG tablet; Take 1 tablet (600 mg total) by mouth every 8 (eight) hours as needed.  Dispense: 60 tablet; Refill: 0   2. Muscle spasm - methocarbamol (ROBAXIN) 500 MG tablet; Take 2 tablets (1,000 mg total) by mouth every 8 (eight) hours as needed for muscle spasms.  Dispense: 90 tablet; Refill: 0   3. Primary hypertension Continue amlodipine 10mg  daily and continue home BP readings   4. Extrinsic asthma with acute exacerbation, unspecified asthma severity, unspecified whether persistent Inhaler prn   5. Encounter for examination following treatment at hospital Improving OOW note for this week   Since that visit the patient's chest pain has somewhat resolved she  has had mild wheezing with her albuterol use increased.  She maintains amlodipine 10 mg daily but on arrival today blood pressure still elevated 142/90.  She has episodes where she has gait disturbance and frequent falls.  She is fallen and hurt her foot.  Upon further inquiry she is having visual field cuts peripherally which make it difficult for her to ambulate.  Patient does continue to smoke about 1 cigarette a day.  Patient also had history of snoring and sleep apnea in the past but is not on CPAP therapy.  Patient is due a  tetanus vaccine and she will receive it.  She was screened for HIV and hepatitis C in 2010 was negative.  She had a cath at 57 years of age when she had a chest pain syndrome the catheter study was negative.  She was told she had an acute myocardial infarction at that time.  She is also had spells of syncope in the past with bradycardia.  She had a visit in 2016 with cardiology and had a 24-hour monitor placed with no significant events.  Echocardiogram was unremarkable.  Patient did have an MRI of the brain in 2018 as documented below showed mild white matter changes  Patient works in Clinical research associate at [employer]   8/13 Patient seen in return follow-up has not been seen since 2022 by Dr. Delford Field that had been seen by PA [name] in May of this year.  Patient has had progressive worsening in balance.  Note she is uninsured with neurology was referred MRI was considered but she cannot afford this therefore has not been seen.  Also difficulty with sleep awakens many times snores has headaches decreased memory does not fall asleep while driving or watching TV.  On arrival blood pressure elevated 155/105.  The patient is on the amlodipine 10 mg daily and hydrochlorothiazide low-dose.  09/15/23 This patient is seen in return follow-up and has had frequent falls and tends to trip on steps.  She has some degree of headaches and dizziness.  She has a neurology consult pending for potential migraines may be recurrent TIAs.  In March of this year she did have a TIA was admitted briefly.  She has cerebellar involvement.  She also has an upcoming visit with neurology but not cardiology.  Blood pressure on arrival is elevated 158/103 at home it runs in the same ranges.  She also has a dry cough and wheezing history of asthma she does have the albuterol inhaler. Past Medical History:  Diagnosis Date   Asthma    Financial difficulties    Hypertension    MI (myocardial infarction) (HCC)    Migraine headache     Murmur, cardiac     Past Surgical History:  Procedure Laterality Date   APPENDECTOMY     CARDIAC CATHETERIZATION     ESOPHAGOGASTRODUODENOSCOPY (EGD) WITH PROPOFOL  1999   second in 2001   tubal ligation       Family History  Problem Relation Age of Onset   Hypertension Mother    Heart attack Mother    Breast cancer Mother    Uterine cancer Mother    Hyperlipidemia Mother    Heart attack Father    Hypertension Father    Seizures Father    Epilepsy Father     Social History   Socioeconomic History   Marital status: Married    Spouse name: Not on file   Number of children: 4   Years of education: Not  on file   Highest education level: Associate degree: academic program  Occupational History   Not on file  Tobacco Use   Smoking status: Former    Current packs/day: 0.00    Average packs/day: 0.3 packs/day for 5.0 years (1.3 ttl pk-yrs)    Types: Cigarettes    Start date: 02/27/2018    Quit date: 02/28/2023    Years since quitting: 0.5   Smokeless tobacco: Current  Vaping Use   Vaping status: Never Used  Substance and Sexual Activity   Alcohol use: Yes    Comment: occ   Drug use: Yes    Types: Marijuana   Sexual activity: Yes    Birth control/protection: None  Other Topics Concern   Not on file  Social History Narrative   Not on file   Social Determinants of Health   Financial Resource Strain: Medium Risk (05/02/2023)   Overall Financial Resource Strain (CARDIA)    Difficulty of Paying Living Expenses: Somewhat hard  Food Insecurity: Not on file  Transportation Needs: No Transportation Needs (05/02/2023)   PRAPARE - Administrator, Civil Service (Medical): No    Lack of Transportation (Non-Medical): No  Physical Activity: Sufficiently Active (05/02/2023)   Exercise Vital Sign    Days of Exercise per Week: 3 days    Minutes of Exercise per Session: 150+ min  Stress: Stress Concern Present (05/02/2023)   [employer] of Occupational  Health - Occupational Stress Questionnaire    Feeling of Stress : To some extent  Social Connections: Moderately Integrated (05/02/2023)   Social Connection and Isolation Panel [NHANES]    Frequency of Communication with Friends and Family: Three times a week    Frequency of Social Gatherings with Friends and Family: Once a week    Attends Religious Services: 1 to 4 times per year    Active Member of [employer] or Organizations: No    Attends Engineer, structural: Not on file    Marital Status: Married  Catering manager Violence: Not on file    ROS Review of Systems  Constitutional:        Sleep disturbance  HENT: Negative.    Eyes:  Negative for visual disturbance.  Respiratory:  Positive for cough. Negative for shortness of breath and wheezing.   Cardiovascular:  Negative for chest pain, palpitations and leg swelling.  Gastrointestinal: Negative.   Genitourinary: Negative.   Musculoskeletal:  Negative for gait problem.  Neurological:  Positive for weakness, light-headedness and headaches. Negative for dizziness.  Hematological: Negative.   Psychiatric/Behavioral:  Positive for decreased concentration.     Objective:   Today's Vitals: BP (!) 158/103 (BP Location: Left Arm, Patient Position: Sitting, Cuff Size: Large)   Pulse 61   Wt 168 lb 12.8 oz (76.6 kg)   LMP 09/19/2017 Comment: shielded  SpO2 92%   BMI 29.90 kg/m   Physical Exam Constitutional:      General: She is not in acute distress.    Appearance: She is obese.  HENT:     Head: Normocephalic and atraumatic.     Nose: Nose normal.     Mouth/Throat:     Mouth: Mucous membranes are moist.     Pharynx: Oropharynx is clear.  Eyes:     Extraocular Movements: Extraocular movements intact.     Pupils: Pupils are equal, round, and reactive to light.     Comments: Apparent visual field cut bilaterally  Cardiovascular:     Rate and Rhythm: Normal rate  and regular rhythm.     Pulses: Normal pulses.     Heart  sounds: Normal heart sounds.  Pulmonary:     Effort: Pulmonary effort is normal.     Breath sounds: Normal breath sounds.  Abdominal:     General: Abdomen is flat. Bowel sounds are normal.     Palpations: Abdomen is soft.  Musculoskeletal:        General: Normal range of motion.  Skin:    General: Skin is warm and dry.  Neurological:     General: No focal deficit present.     Mental Status: She is alert and oriented to person, place, and time. Mental status is at baseline.     Cranial Nerves: No cranial nerve deficit.     Sensory: No sensory deficit.     Motor: No weakness.     Coordination: Coordination abnormal.     Gait: Gait abnormal.     Deep Tendon Reflexes: Reflexes normal.     Comments: Performs very poorly with finger-to-nose study  Psychiatric:        Mood and Affect: Mood normal.        Behavior: Behavior normal.        Thought Content: Thought content normal.        Judgment: Judgment normal.     MRI 2018 IMPRESSION: 1. No acute finding. 2. Mild cerebral white matter disease presumably related to patient's history of hypertension and migraines. 3. 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 Assessment & Plan:   Problem List Items Addressed This Visit       Cardiovascular and Mediastinum   HTN (hypertension)    Blood pressure not well-controlled plan is to add carvedilol 12.5 mg twice daily and continue with chlorthalidone amlodipine and valsartan.  Check labs today.  Will make a referral to the advanced hypertension clinic for cardiology Return to see clinical pharmacy 3 weeks and primary care reestablishment 2 months      Relevant Medications   amLODipine (NORVASC) 10 MG tablet   atorvastatin (LIPITOR) 10 MG tablet   chlorthalidone (HYGROTON) 25 MG tablet   valsartan (DIOVAN) 320 MG tablet   carvedilol (COREG) 12.5 MG tablet   Other Relevant Orders   Ambulatory referral to Advanced Hypertension Clinic      Respiratory   Asthma, moderate persistent - Primary    No active wheezing will continue with albuterol as needed      Relevant Medications   albuterol (VENTOLIN HFA) 108 (90 Base) MCG/ACT inhaler     Other   Multiple falls    Might benefit from physical therapy we will see what neurology has to say      Tobacco use    Mild tobacco use    Current smoking consumption amount: 1 cigarette a day  Dicsussion on advise to quit smoking and smoking impacts: Cardiovascular lung impacts  Patient's willingness to quit: Wants to quit  Methods to quit smoking discussed: Behavioral modification medication management  Medication management of smoking session drugs discussed: Nicotine lozenge  Resources provided:  AVS   Setting quit date 12 weeks  Follow-up arranged 2 months   Time spent counseling the patient: 5 minutes       Hypokalemia    Reassess lab         Outpatient Encounter Medications as of 09/15/2023  Medication Sig   benzonatate (TESSALON) 100 MG capsule Take 1 capsule (100 mg total) by mouth 2 (two) times daily as  needed for cough.   carvedilol (COREG) 12.5 MG tablet Take 1 tablet (12.5 mg total) by mouth 2 (two) times daily with a meal.   diclofenac Sodium (VOLTAREN ARTHRITIS PAIN) 1 % GEL Apply 2 g topically 4 (four) times daily.   Iron-Vitamins (GERITOL) LIQD Take 15 mLs by mouth daily.   naproxen sodium (ALEVE) 220 MG tablet Take 220 mg by mouth daily as needed (for headache).   [DISCONTINUED] albuterol (VENTOLIN HFA) 108 (90 Base) MCG/ACT inhaler Inhale 1 puff into the lungs every 6 (six) hours as needed for wheezing or shortness of breath.   [DISCONTINUED] amLODipine (NORVASC) 10 MG tablet Take 1 tablet (10 mg total) by mouth daily.   [DISCONTINUED] atorvastatin (LIPITOR) 10 MG tablet Take 1 tablet (10 mg total) by mouth daily.   [DISCONTINUED] chlorthalidone (HYGROTON) 25 MG tablet Take 1 tablet (25 mg total) by mouth daily.   [DISCONTINUED] metroNIDAZOLE  (FLAGYL) 500 MG tablet Take 1 tablet (500 mg total) by mouth 2 (two) times daily.   [DISCONTINUED] valsartan (DIOVAN) 320 MG tablet Take 1 tablet (320 mg total) by mouth daily.   albuterol (VENTOLIN HFA) 108 (90 Base) MCG/ACT inhaler Inhale 1 puff into the lungs every 6 (six) hours as needed for wheezing or shortness of breath.   amLODipine (NORVASC) 10 MG tablet Take 1 tablet (10 mg total) by mouth daily.   atorvastatin (LIPITOR) 10 MG tablet Take 1 tablet (10 mg total) by mouth daily.   chlorthalidone (HYGROTON) 25 MG tablet Take 1 tablet (25 mg total) by mouth daily.   valsartan (DIOVAN) 320 MG tablet Take 1 tablet (320 mg total) by mouth daily.   No facility-administered encounter medications on file as of 09/15/2023.   30 minutes spent extra time needed for multisystem assessments high degree of complexity Follow-up: Return for htn, primary care follow up.   Shan Levans, MD

## 2023-09-15 NOTE — Assessment & Plan Note (Signed)
Mild tobacco use    . Current smoking consumption amount: 1 cigarette a day  . Dicsussion on advise to quit smoking and smoking impacts: Cardiovascular lung impacts  . Patient's willingness to quit: Wants to quit  . Methods to quit smoking discussed: Behavioral modification medication management  . Medication management of smoking session drugs discussed: Nicotine lozenge  . Resources provided:  AVS   . Setting quit date 12 weeks  . Follow-up arranged 2 months   Time spent counseling the patient: 5 minutes

## 2023-09-15 NOTE — Telephone Encounter (Signed)
Called patient to schedule an appointment for BP check. I was unable to reach the patient so I left a HIPAA-compliant message requesting that the patient return my call.   Butch Penny, PharmD, P[place]y [place], CPP Clinical Pharmacist [facility] [phone]

## 2023-09-15 NOTE — Patient Instructions (Signed)
Mammogram was ordered we will get you this for free on a scholarship program Colon cancer kit was issued for screening from the lab Start carvedilol 1 pill twice daily stay on all other medications which were refilled See our clinical pharmacist 3 weeks and referral made to the advanced high blood pressure clinic Keep your neurology appointment Return for primary care 3 months Take benzonatate 3 times a day as needed for cough Watch your ambulation be careful on stairs

## 2023-09-15 NOTE — Assessment & Plan Note (Signed)
Blood pressure not well-controlled plan is to add carvedilol 12.5 mg twice daily and continue with chlorthalidone amlodipine and valsartan.  Check labs today.  Will make a referral to the advanced hypertension clinic for cardiology Return to see clinical pharmacy 3 weeks and primary care reestablishment 2 months

## 2023-09-15 NOTE — Assessment & Plan Note (Signed)
Reassess lab

## 2023-09-15 NOTE — Assessment & Plan Note (Signed)
No active wheezing will continue with albuterol as needed

## 2023-09-15 NOTE — Assessment & Plan Note (Signed)
Might benefit from physical therapy we will see what neurology has to say

## 2023-09-19 ENCOUNTER — Encounter: Payer: Self-pay | Admitting: Neurology

## 2023-09-19 ENCOUNTER — Ambulatory Visit (INDEPENDENT_AMBULATORY_CARE_PROVIDER_SITE_OTHER): Payer: Self-pay | Admitting: Neurology

## 2023-09-19 VITALS — BP 145/98 | HR 72 | Ht 63.0 in | Wt 167.0 lb

## 2023-09-19 DIAGNOSIS — I679 Cerebrovascular disease, unspecified: Secondary | ICD-10-CM

## 2023-09-19 DIAGNOSIS — H814 Vertigo of central origin: Secondary | ICD-10-CM

## 2023-09-19 DIAGNOSIS — G43119 Migraine with aura, intractable, without status migrainosus: Secondary | ICD-10-CM

## 2023-09-19 DIAGNOSIS — R296 Repeated falls: Secondary | ICD-10-CM

## 2023-09-19 DIAGNOSIS — R42 Dizziness and giddiness: Secondary | ICD-10-CM

## 2023-09-19 NOTE — Patient Instructions (Addendum)
Check MRI of brain without contrast and MRA head and neck Check routine EEG Start Emgality - 2 injections for first dose, then plan is for 1 injection every 28 days thereafter Try samples of Ubrelvy - take one tablet at earliest onset of migraine.  May repeat after 2 hours.  Maximum 2 tablets in 24 hours.  Let me know if it works or not Once you are on Medicaid, let us know and give [place] your insurance information so that I may send in a prescription for migraine prevention Limit use of pain relievers to no more than 2 days out of week to prevent risk of rebound or medication-overuse headache. Discontinue marijuana smoking Follow up 6 months.

## 2023-09-21 ENCOUNTER — Telehealth: Payer: Self-pay | Admitting: Neurology

## 2023-09-21 ENCOUNTER — Ambulatory Visit (INDEPENDENT_AMBULATORY_CARE_PROVIDER_SITE_OTHER): Payer: Self-pay | Admitting: Neurology

## 2023-09-21 DIAGNOSIS — R404 Transient alteration of awareness: Secondary | ICD-10-CM

## 2023-09-21 NOTE — Progress Notes (Signed)
ELECTROENCEPHALOGRAM REPORT  Date of Study: 09/21/2023  Patient's Name: Marilyn Owen MRN: [id number] Date of Birth: 09/30/1966   Clinical History: 57 year old female with recurrent transient altered sensorium/dizziness causing falls.  Medications: Current Outpatient Medications on File Prior to Visit  Medication Sig Dispense Refill   acetaminophen (TYLENOL) 500 MG tablet Take 500 mg by mouth every 6 (six) hours as needed.     albuterol (VENTOLIN HFA) 108 (90 Base) MCG/ACT inhaler Inhale 1 puff into the lungs every 6 (six) hours as needed for wheezing or shortness of breath. 6.7 g 1   amLODipine (NORVASC) 10 MG tablet Take 1 tablet (10 mg total) by mouth daily. 90 tablet 1   atorvastatin (LIPITOR) 10 MG tablet Take 1 tablet (10 mg total) by mouth daily. 90 tablet 3   benzonatate (TESSALON) 100 MG capsule Take 1 capsule (100 mg total) by mouth 2 (two) times daily as needed for cough. 20 capsule 0   carvedilol (COREG) 12.5 MG tablet Take 1 tablet (12.5 mg total) by mouth 2 (two) times daily with a meal. 120 tablet 3   chlorthalidone (HYGROTON) 25 MG tablet Take 1 tablet (25 mg total) by mouth daily. 90 tablet 2   diclofenac Sodium (VOLTAREN ARTHRITIS PAIN) 1 % GEL Apply 2 g topically 4 (four) times daily. 100 g 3   Iron-Vitamins (GERITOL) LIQD Take 15 mLs by mouth daily.     naproxen sodium (ALEVE) 220 MG tablet Take 220 mg by mouth daily as needed (for headache).     valsartan (DIOVAN) 320 MG tablet Take 1 tablet (320 mg total) by mouth daily. 90 tablet 3   No current facility-administered medications on file prior to visit.    Technical Summary: A multichannel digital EEG recording measured by the international 10-20 system with electrodes applied with paste and impedances below 5000 ohms performed in our laboratory with EKG monitoring in an awake and asleep patient.  Photic stimulation was performed.  The digital EEG was referentially recorded, reformatted, and digitally filtered in a  variety of bipolar and referential montages for optimal display.    Description: The patient is awake and asleep during the recording.  During maximal wakefulness, there is a symmetric, medium voltage 10 Hz posterior dominant rhythm that attenuates with eye opening.  The record is symmetric.  During drowsiness and sleep, there is an increase in theta slowing of the background.  Stage 2 sleep was seen.  Photic stimulation did not elicit any abnormalities.  There were no epileptiform discharges or electrographic seizures seen.    EKG lead was unremarkable.  Impression: This awake and asleep EEG is normal.    Clinical Correlation: A normal EEG does not exclude a clinical diagnosis of epilepsy.  If further clinical questions remain, prolonged EEG may be helpful.  Clinical correlation is advised.   Shon Millet, DO

## 2023-09-21 NOTE — Telephone Encounter (Signed)
Patient advised.

## 2023-09-21 NOTE — Telephone Encounter (Signed)
Please let patient know that EEG is normal.  No further recommendations at this time.

## 2023-09-21 NOTE — Progress Notes (Signed)
EEG complete - results pending 

## 2023-11-10 ENCOUNTER — Other Ambulatory Visit: Payer: Self-pay

## 2023-12-01 ENCOUNTER — Institutional Professional Consult (permissible substitution) (HOSPITAL_BASED_OUTPATIENT_CLINIC_OR_DEPARTMENT_OTHER): Payer: Self-pay | Admitting: Family

## 2023-12-20 ENCOUNTER — Encounter (HOSPITAL_BASED_OUTPATIENT_CLINIC_OR_DEPARTMENT_OTHER): Payer: Self-pay

## 2024-02-20 NOTE — Telephone Encounter (Signed)
 Telephoned patient at mobile number and mailbox full. [facility] (scholarship)

## 2024-03-20 ENCOUNTER — Ambulatory Visit: Payer: Self-pay | Admitting: Neurology

## 2024-06-20 DIAGNOSIS — Z029 Encounter for administrative examinations, unspecified: Secondary | ICD-10-CM

## 2024-07-06 NOTE — Progress Notes (Deleted)
 NEUROLOGY FOLLOW UP OFFICE NOTE  [name] [id number]  Assessment/Plan:   Transient altered sensorium - unclear etiology.  Would rule out seizure or symptomatic vertebrobasilar insufficiency or extracranial stenosis. 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 Hypertension   For workup of transient altered sensorium/falls/dizziness, headaches and cerebrovascular disease: MRI brain and MRA head and neck For migraine prevention:  Start Emgality.  Provide sample today.  Would not use Aimovig due to uncontrolled hypertension.  Currently without insurance but plans to sign up for Medicaid.  Will provide samples and have her fill out [employer] form to help with coverage.  Once she has insurance, she will contact us  with information For migraine rescue:  Ubrelvy 100mg  samples given.  Once she has insurance, she will contact us  with the information. Limit use of pain relievers to no more than 2 days out of week to prevent risk of rebound or medication-overuse headache. Keep headache diary Discontinue marijuana smoking Follow up with PCP regarding blood pressure Follow up 6 months.     Subjective:  Marilyn Owen is a 58 year old female with CAD s/p MI, HTN, migraine with aura and asthma who follows up for transient altered sensorium and migraine.  UPDATE: Awake and asleep EEG on 09/21/2023 was normal. MRI brain and MRA head and neck were ordered but never performed as she ***.  Plan was to start Emgality.  At the time, she didn't have insurance.  We provided samples and had her apply for the [employer] financial assistance program.  ***.  Also provided her samples of Ubrelvy ***  Intensity:  *** Duration:  *** Frequency:  *** Frequency of abortive medication: *** Current NSAIDS/analgesics:  naproxen , Tylenol  Current triptans:   none Current ergotamine:  none Current anti-emetic:  none Current muscle relaxants:  Robaxin  Current Antihypertensive medications:  carvedilol , valsartan , amlodipine  Current Antidepressant medications:  none Current Anticonvulsant medications:  none Current anti-CGRP:  Emgality, Ubrelvy 100mg  Current Vitamins/Herbal/Supplements:  *** Current Antihistamines/Decongestants:  *** Other therapy:  ***   Caffeine :  *** Alcohol:  *** Smoker:  *** Diet:  *** Exercise:  *** Depression:  ***; Anxiety:  *** Other pain:  *** Sleep hygiene:  ***  HISTORY: Reports poor balance for several years since 2017-2018.  Progressively becoming more frequent.  She experiences a sudden wave coming over her in her head and travels down into her stomach and becomes off balance.  She usually catches herself most of time, so she doesn't always fall.  Lasts just a couple of seconds.  Sometimes she feels like she may pass out but she hasn't lost consciousness.  She may have that sensation even while sitting.  It occurs several times a day.    She has history of health issues.  She has longstanding history of recurrent chest pain with negative cardiac workup.  She has history of diffuse musculoskeletal pain.  She has history of hypertension and was seen in the ED back in March with blood pressure in the 160s/100s.  Amlodipine  has been increased.  BP last week was 158/103.     She has history of migraines since she was a teenager.  Squeezing headache starts in back of head and radiates into the temples.  Associated with left sided facial numbness/pulling sensation.  Possibly trace extremity weakness.  Also associated with nausea, vomiting, photophobia, phonophobia, lacrimation and blurred vision.  Worse with change  in barometric pressure.  They occur at least 3 times a month and may last 2 to 3 days.  Treats with Aleve , Tylenol  and Robaxin .  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     Past medications:  topiramate, venlafaxine, gabapentin, tramadol   Smokes marijuana.    PAST MEDICAL HISTORY: Past Medical History:  Diagnosis Date   Asthma    Financial difficulties    Hypertension    MI (myocardial infarction) (HCC)    Migraine headache    Murmur, cardiac     MEDICATIONS: Current Outpatient Medications on File Prior to Visit  Medication Sig Dispense Refill   acetaminophen  (TYLENOL ) 500 MG tablet Take 500 mg by mouth every 6 (six) hours as needed.     albuterol  (VENTOLIN  HFA) 108 (90 Base) MCG/ACT inhaler Inhale 1 puff into the lungs every 6 (six) hours as needed for wheezing or shortness of breath. 6.7 g 1   amLODipine  (NORVASC ) 10 MG tablet Take 1 tablet (10 mg total) by mouth daily. 90 tablet 1   atorvastatin  (LIPITOR) 10 MG tablet Take 1 tablet (10 mg total) by mouth daily. 90 tablet 3   benzonatate  (TESSALON ) 100 MG capsule Take 1 capsule (100 mg total) by mouth 2 (two) times daily as needed for cough. 20 capsule 0   carvedilol  (COREG ) 12.5 MG tablet Take 1 tablet (12.5 mg total) by mouth 2 (two) times daily with a meal. 120 tablet 3   chlorthalidone  (HYGROTON ) 25 MG tablet Take 1 tablet (25 mg total) by mouth daily. 90 tablet 2   diclofenac  Sodium (VOLTAREN  ARTHRITIS PAIN) 1 % GEL Apply 2 g topically 4 (four) times daily. 100 g 3   Iron-Vitamins (GERITOL) LIQD Take 15 mLs by mouth daily.     naproxen  sodium (ALEVE ) 220 MG tablet Take 220 mg by mouth daily as needed (for headache).     valsartan  (DIOVAN ) 320 MG tablet Take 1 tablet (320 mg total) by mouth daily. 90 tablet 3   No current facility-administered medications on file prior to visit.    ALLERGIES: Allergies  Allergen Reactions   Almond Oil Anaphylaxis    Almonds    Banana Anaphylaxis   Benadryl  [Diphenhydramine Hcl] Anaphylaxis   Penicillins Anaphylaxis    FAMILY HISTORY: Family History  Problem Relation Age of Onset   Migraines Mother    Hypertension Mother    Heart attack Mother    Breast cancer Mother    Uterine cancer Mother    Hyperlipidemia Mother    Heart attack Father    Hypertension Father    Seizures Father    Epilepsy Father       Objective:  *** General: No acute distress.  Patient appears ***-groomed.   Head:  Normocephalic/atraumatic Eyes:  Fundi examined but not visualized Neck: supple, no paraspinal tenderness, full range of motion Heart:  Regular rate and rhythm Lungs:  Clear to auscultation bilaterally Back: No paraspinal tenderness Neurological Exam: alert and oriented.  Speech fluent and not dysarthric, language intact.  CN II-XII intact. Bulk and tone normal, muscle strength 5/5 throughout.  Sensation to light touch intact.  Deep tendon reflexes 2+ throughout, toes downgoing.  Finger to nose testing intact.  Gait normal, Romberg negative.   Juliene Dunnings, DO  CC: ***

## 2024-07-09 ENCOUNTER — Encounter: Payer: Self-pay | Admitting: Neurology

## 2024-07-09 ENCOUNTER — Ambulatory Visit: Payer: Self-pay | Admitting: Neurology

## 2024-08-21 ENCOUNTER — Other Ambulatory Visit: Payer: Self-pay

## 2024-08-21 ENCOUNTER — Encounter (HOSPITAL_BASED_OUTPATIENT_CLINIC_OR_DEPARTMENT_OTHER): Payer: Self-pay | Admitting: Emergency Medicine

## 2024-08-21 ENCOUNTER — Emergency Department (HOSPITAL_BASED_OUTPATIENT_CLINIC_OR_DEPARTMENT_OTHER)
Admission: EM | Admit: 2024-08-21 | Discharge: 2024-08-21 | Disposition: A | Discharge status: Home / Self Care | Payer: Self-pay | Attending: Emergency Medicine | Admitting: Emergency Medicine

## 2024-08-21 ENCOUNTER — Emergency Department (HOSPITAL_BASED_OUTPATIENT_CLINIC_OR_DEPARTMENT_OTHER): Payer: Self-pay | Admitting: Radiology

## 2024-08-21 DIAGNOSIS — S8002XA Contusion of left knee, initial encounter: Secondary | ICD-10-CM | POA: Insufficient documentation

## 2024-08-21 DIAGNOSIS — R030 Elevated blood-pressure reading, without diagnosis of hypertension: Secondary | ICD-10-CM | POA: Insufficient documentation

## 2024-08-21 DIAGNOSIS — Y990 Civilian activity done for income or pay: Secondary | ICD-10-CM | POA: Insufficient documentation

## 2024-08-21 DIAGNOSIS — W2209XA Striking against other stationary object, initial encounter: Secondary | ICD-10-CM | POA: Insufficient documentation

## 2024-08-21 NOTE — ED Triage Notes (Signed)
 Pt c/o LT knee pain and swelling. Reports hitting on desk last week. Endorses pain throughout LLE. Took aleve   mins pta

## 2024-08-21 NOTE — Discharge Instructions (Addendum)
 You likely bruised your knee.  Follow-up with orthopedic surgery as needed.  Your blood pressure is also elevated.  Take your medicines more consistently.

## 2024-08-21 NOTE — ED Provider Notes (Signed)
 [place] [facility] AT [facility] Provider Note   CSN: [id number] Arrival date & time: 08/21/24  9285     Patient presents with: Knee Pain   Marilyn Owen is a 58 y.o. female.    Knee Pain Patient presents with left knee pain and swelling.  Couple weeks ago hit the anterior lower part of her left knee while at work.  Did not hurt too much at the time but since then has had some swelling and pain in the area.  States whole knee feels swelling now.  Worse with walking on it.  Has not hurt much just sitting there.     Prior to Admission medications   Medication Sig Start Date End Date Taking? Authorizing Provider  acetaminophen  (TYLENOL ) 500 MG tablet Take 500 mg by mouth every 6 (six) hours as needed.    [provider]  albuterol  (VENTOLIN  HFA) 108 (90 Base) MCG/ACT inhaler Inhale 1 puff into the lungs every 6 (six) hours as needed for wheezing or shortness of breath. 09/15/23   Brien Belvie BRAVO, MD  amLODipine  (NORVASC ) 10 MG tablet Take 1 tablet (10 mg total) by mouth daily. 09/15/23   Brien Belvie BRAVO, MD  atorvastatin  (LIPITOR) 10 MG tablet Take 1 tablet (10 mg total) by mouth daily. 09/15/23   Brien Belvie BRAVO, MD  benzonatate  (TESSALON ) 100 MG capsule Take 1 capsule (100 mg total) by mouth 2 (two) times daily as needed for cough. 09/15/23   Brien Belvie BRAVO, MD  carvedilol  (COREG ) 12.5 MG tablet Take 1 tablet (12.5 mg total) by mouth 2 (two) times daily with a meal. 09/15/23   Brien Belvie BRAVO, MD  chlorthalidone  (HYGROTON ) 25 MG tablet Take 1 tablet (25 mg total) by mouth daily. 09/15/23   Brien Belvie BRAVO, MD  diclofenac  Sodium (VOLTAREN  ARTHRITIS PAIN) 1 % GEL Apply 2 g topically 4 (four) times daily. 05/02/23   Danton Jon HERO, PA-C  Iron-Vitamins (GERITOL) LIQD Take 15 mLs by mouth daily.    [provider]  naproxen  sodium (ALEVE ) 220 MG tablet Take 220 mg by mouth daily as needed (for headache).    [provider]  valsartan   (DIOVAN ) 320 MG tablet Take 1 tablet (320 mg total) by mouth daily. 09/15/23   Brien Belvie BRAVO, MD    Allergies: Almond oil, Banana, Benadryl [diphenhydramine hcl], and Penicillins    Review of Systems  Updated Vital Signs BP (!) 164/113   Pulse 62   Temp 98 F (36.7 C) (Oral)   Resp 18   Wt 82.6 kg   LMP 09/19/2017 Comment: shielded  SpO2 97%   BMI 32.24 kg/m   Physical Exam Vitals and nursing note reviewed.  Musculoskeletal:     Comments: Some tenderness to anterior lower left knee.  No large effusion.  Good range of motion.  No real pain with varus or valgus strain.  Does have pain with axial loading.  Did hurt somewhat with force being pulled forward on the lower leg.  Neurological:     Mental Status: She is alert.     (all labs ordered are listed, but only abnormal results are displayed) Labs Reviewed - No data to display  EKG: None  Radiology: DG Knee Complete 4 Views Left Result Date: 08/21/2024 EXAM: 4 VIEW(S) XRAY OF THE LEFT KNEE 08/21/2024 07:52:00 AM COMPARISON: None available. CLINICAL HISTORY: Knee injury. Pt c/o LT knee pain and swelling. Reports hitting on desk last week. Endorses pain throughout LLE. Took aleve  mins  pta. FINDINGS: Nonstandard views with the knee held in partial flexion. BONES AND JOINTS: No acute fracture. No focal osseous lesion. No joint dislocation. No significant joint effusion. No significant degenerative changes. SOFT TISSUES: The soft tissues are unremarkable. IMPRESSION: 1. No acute fracture or dislocation. 2. Nonstandard views with the knee held in partial flexion. Electronically signed by: Dayne Hassell MD 08/21/2024 07:57 AM EDT RP Workstation: [id number]     Procedures   Medications Ordered in the ED - No data to display                                  Medical Decision Making Amount and/or Complexity of Data Reviewed Radiology: ordered.   Patient with knee pain.  After direct blow.  Does have mild swelling.  X-ray  reassuring.  No clear dislocation or fracture.  Will give immobilizer and follow-up with Ortho.  Also had blood hide blood pressure will need to be followed.     Final diagnoses:  Contusion of left knee, initial encounter  Elevated blood pressure reading    ED Discharge Orders     None          Marilyn Lot, MD 08/21/24 1513

## 2024-08-21 NOTE — ED Notes (Signed)
 Pt given discharge instructions and reviewed care with immobilizer. Opportunities given for questions. Pt verbalizes understanding. Pt to follow-up with Ortho, referral given. VSS. Pt ambulated off unit at dc. Bethena Powell SAUNDERS, RN

## 2024-09-21 ENCOUNTER — Other Ambulatory Visit: Payer: Self-pay
